# Patient Record
Sex: Male | Born: 1995 | Race: White | Hispanic: No | Marital: Single | Smoking: Never smoker
Health system: Southern US, Community
[De-identification: ages and names within clinical notes are randomized; demographics above are authoritative.]

## PROBLEM LIST (undated history)

## (undated) DIAGNOSIS — R519 Headache, unspecified: Secondary | ICD-10-CM

## (undated) DIAGNOSIS — R51 Headache: Secondary | ICD-10-CM

## (undated) DIAGNOSIS — G514 Facial myokymia: Secondary | ICD-10-CM

## (undated) HISTORY — PX: CIRCUMCISION: SUR203

## (undated) HISTORY — PX: OTHER SURGICAL HISTORY: SHX169

## (undated) HISTORY — DX: Headache: R51

## (undated) HISTORY — DX: Headache, unspecified: R51.9

---
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
## 2016-03-05 ENCOUNTER — Telehealth (INDEPENDENT_AMBULATORY_CARE_PROVIDER_SITE_OTHER): Payer: Self-pay

## 2016-03-05 ENCOUNTER — Encounter (HOSPITAL_BASED_OUTPATIENT_CLINIC_OR_DEPARTMENT_OTHER): Payer: Self-pay | Admitting: *Deleted

## 2016-03-05 ENCOUNTER — Emergency Department (HOSPITAL_BASED_OUTPATIENT_CLINIC_OR_DEPARTMENT_OTHER)
Admission: EM | Admit: 2016-03-05 | Discharge: 2016-03-05 | Disposition: A | Discharge status: Home / Self Care | Payer: Managed Care, Other (non HMO) | Attending: Emergency Medicine | Admitting: Emergency Medicine

## 2016-03-05 DIAGNOSIS — G43011 Migraine without aura, intractable, with status migrainosus: Secondary | ICD-10-CM | POA: Diagnosis not present

## 2016-03-05 DIAGNOSIS — R51 Headache: Secondary | ICD-10-CM | POA: Diagnosis present

## 2016-03-05 HISTORY — DX: Facial myokymia: G51.4

## 2016-03-05 MED ORDER — PROMETHAZINE HCL 25 MG/ML IJ SOLN
12.5000 mg | Freq: Once | INTRAMUSCULAR | Status: AC
  Administered 2016-03-05: 12.5 mg via INTRAVENOUS
  Filled 2016-03-05: qty 1

## 2016-03-05 MED ORDER — SODIUM CHLORIDE 0.9 % IV SOLN: INTRAVENOUS | Status: DC

## 2016-03-05 MED ORDER — SODIUM CHLORIDE 0.9 % IV BOLUS (SEPSIS)
1000.0000 mL | Freq: Once | INTRAVENOUS | Status: AC
  Administered 2016-03-05: 1000 mL via INTRAVENOUS

## 2016-03-05 MED ORDER — DEXAMETHASONE SODIUM PHOSPHATE 10 MG/ML IJ SOLN
10.0000 mg | Freq: Once | INTRAMUSCULAR | Status: AC
  Administered 2016-03-05: 10 mg via INTRAVENOUS
  Filled 2016-03-05: qty 1

## 2016-03-05 MED ORDER — DIPHENHYDRAMINE HCL 50 MG/ML IJ SOLN
25.0000 mg | Freq: Once | INTRAMUSCULAR | Status: AC
  Administered 2016-03-05: 25 mg via INTRAVENOUS
  Filled 2016-03-05: qty 1

## 2016-03-05 NOTE — ED Notes (Signed)
ED Provider at bedside. 

## 2016-03-05 NOTE — ED Provider Notes (Signed)
[facility] Provider Note   CSN: [id number] Arrival date & time: 03/05/16  1740  By signing my name below, I, Teofilo Pod, attest that this documentation has been prepared under the direction and in the presence of Vanetta Mulders, MD . Electronically Signed: Teofilo Pod, ED Scribe. 03/05/2016. 8:13 PM.   History   Chief Complaint Chief Complaint  Patient presents with  . Headache   The history is provided by the patient and a parent. No language interpreter was used.   HPI Comments:  Spencer Bailey is a 21 y.o. male with PMHx of migraines who presents to the Emergency Department complaining of constant headache x 4 days. Pt states that the headaches is primarily frontal and rates the pain at 4 or 5/10. Pt states that he has been having intermittent 3-5 minute episodes of double vision and that his eyes twitch when he moves them. Mom reports that 3 years ago the pt developed superior oblique myokymia of the right eye which she states tends to trigger migraines.  Pt takes Carbamezapine 600mg  daily. Mom reports that pt has been given migraine cocktails in the past for previous migraines. No alleviating factors noted. Pt denies nausea, vomiting, fever, rhinorrhea, sore throat, cough, SOB, chest pain, leg swelling, abdominal pain, diarrhea, dysuria, hematuria, back pain, rash, numbness, weakness, and bleeding easily.   Past Medical History:  Diagnosis Date  . Headache   . Superior oblique myokymia of right eye     Patient Active Problem List   Diagnosis Date Noted  . Concussion without loss of consciousness 07/07/2014  . Episodic tension type headache 12/16/2013  . Superior oblique myokymia of right eye 11/30/2013  . Migraine without aura and without status migrainosus, not intractable 11/30/2013  . History of head injury 11/30/2013    Past Surgical History:  Procedure Laterality Date  . CIRCUMCISION  1998  . OTHER SURGICAL HISTORY     Ankle Surgery x2        Home Medications    Prior to Admission medications   Medication Sig Start Date End Date Taking? Authorizing Provider  carbamazepine (CARBATROL) 200 MG 12 hr capsule Take 2 capsules (400 mg total) by mouth 2 (two) times daily. 08/30/15  Yes Deetta Perla, MD  timolol (TIMOPTIC-XR) 0.5 % ophthalmic gel-forming Place 1 drop into the right eye 2 (two) times daily. 08/30/15  Yes Deetta Perla, MD    Family History Family History  Problem Relation Age of Onset  . Cancer Father   . Diabetes Paternal Grandfather   . COPD Maternal Grandmother     Social History Social History  Substance Use Topics  . Smoking status: Never Smoker  . Smokeless tobacco: Never Used  . Alcohol use No     Allergies   Patient has no known allergies.   Review of Systems Review of Systems  Constitutional: Negative for fever.  HENT: Negative for rhinorrhea and sore throat.   Eyes: Positive for visual disturbance.  Respiratory: Negative for cough and shortness of breath.   Cardiovascular: Negative for chest pain and leg swelling.  Gastrointestinal: Negative for abdominal pain, diarrhea, nausea and vomiting.  Genitourinary: Negative for dysuria and hematuria.  Musculoskeletal: Negative for back pain.  Skin: Negative for rash.  Neurological: Positive for headaches. Negative for weakness and numbness.  Hematological: Does not bruise/bleed easily.  All other systems reviewed and are negative.    Physical Exam Updated Vital Signs BP 121/57 (BP Location: Right Arm)   Pulse  63   Temp 97.8 F (36.6 C) (Oral)   Resp 16   Ht 6\' 2"  (1.88 m)   Wt 73.9 kg   SpO2 100%   BMI 20.93 kg/m   Physical Exam  Constitutional: He appears well-developed and well-nourished.  HENT:  Head: Normocephalic and atraumatic.  Mouth/Throat: Oropharynx is clear and moist.  Eyes: Conjunctivae are normal. Pupils are equal, round, and reactive to light.  Sclera clear, eyes tracking but jumping. Eye  movements are not smooth.   Neck: Neck supple.  Cardiovascular: Normal rate, regular rhythm and normal heart sounds.   No murmur heard. Pulmonary/Chest: Effort normal and breath sounds normal. No respiratory distress.  Lungs clear bilaterally.  Abdominal: Soft. Bowel sounds are normal. There is no tenderness.  Musculoskeletal: He exhibits no edema.  Neurological: He is alert. No cranial nerve deficit or sensory deficit. He exhibits normal muscle tone. Coordination normal.  Skin: Skin is warm and dry.  Psychiatric: He has a normal mood and affect.  Nursing note and vitals reviewed.    ED Treatments / Results  DIAGNOSTIC STUDIES:  Oxygen Saturation is 100% on RA, normal by my interpretation.    COORDINATION OF CARE:  8:12 PM Discussed treatment plan with pt at bedside and pt agreed to plan.   Labs (all labs ordered are listed, but only abnormal results are displayed) Labs Reviewed - No data to display  EKG  EKG Interpretation None       Radiology No results found.  Procedures Procedures (including critical care time)  Medications Ordered in ED Medications  0.9 %  sodium chloride infusion (not administered)  sodium chloride 0.9 % bolus 1,000 mL (0 mLs Intravenous Stopped 03/05/16 2113)  dexamethasone (DECADRON) injection 10 mg (10 mg Intravenous Given 03/05/16 2035)  diphenhydrAMINE (BENADRYL) injection 25 mg (25 mg Intravenous Given 03/05/16 2034)  promethazine (PHENERGAN) injection 12.5 mg (12.5 mg Intravenous Given 03/05/16 2037)     Initial Impression / Assessment and Plan / ED Course  I have reviewed the triage vital signs and the nursing notes.  Pertinent labs & imaging results that were available during my care of the patient were reviewed by me and considered in my medical decision making (see chart for details).     Patient with a known history of migraine headaches. Current symptoms are consistent with a typical migraine for him. They are frequently  triggered by his eye condition which causes twitching of his eyes and causes double vision. In the past when the headache is persisted like it is today it is helped by migraine cocktail. Patient received Decadron Phenergan and Benadryl and IV fluids with significant improvement in the headache.  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 The recorded information has been reviewed and is accurate.        Vanetta MuldersScott Josanne Boerema, MD 03/05/16 2149

## 2016-03-05 NOTE — Discharge Instructions (Signed)
Home and rest in a dark room. Hopefully headache will be completely gone by morning. Return for any new or worse symptoms. Follow-up with his doctors as needed.

## 2016-03-05 NOTE — Telephone Encounter (Signed)
Patient's mother called stating that Spencer Bailey had an episode with his right eye on Thursday. She states that it has been four days and it has not gotten any better. She states that she does not know if Dr. Sharene SkeansHickling would like to see him or if he just needs to speak with her on the phone. I have scheduled him for March 19, 2016 @ 12:00. I informed her that Dr. Rexene EdisonH is out of the office until tomorrow but she will receive a call.   [phone]

## 2016-03-05 NOTE — ED Triage Notes (Signed)
Migraine headache since Thursday.

## 2016-03-05 NOTE — ED Notes (Signed)
Pt c/o migraine type headache for the last five days that has been unrelieved by OTC meds.  Pt states that his opthalmologist wants him to have the "migraine cocktail," before she changes his home meds.

## 2016-03-06 NOTE — Telephone Encounter (Signed)
9 minute phone call with mother.  He continues to have problems with the tremors of his eyes and developed a headache on his way back to school.  [facility] which worked.  His last severe that was in October 2016.  There is no reason to put him on preventative medication for migraines because of the infrequency of these events.  We will obtain a trough carbamazepine level tomorrow which will get sent to my office and I will contact the family.  I asked mother to sign [name] up for My Chart.  I spoke with Dr. Allena KatzPatel and we agreed that a carbamazepine level followed by possible increase in medication would be appropriate.

## 2016-03-07 ENCOUNTER — Telehealth (INDEPENDENT_AMBULATORY_CARE_PROVIDER_SITE_OTHER): Payer: Self-pay

## 2016-03-07 DIAGNOSIS — G514 Facial myokymia: Secondary | ICD-10-CM

## 2016-03-07 NOTE — Telephone Encounter (Signed)
I do not have the result of the carbamazepine level yet.  Unwilling to give him an extra dose of extended release carbamazepine today.  I will going to wait on changing the prescription until I see what the drug level is.  I don't want escalate the dose so fast that he develops carbamazepine toxicity.  Going have to consider whether or not a migraine preventative drug will be necessary.  I hate to introduce it in an acute situation as this.

## 2016-03-07 NOTE — Telephone Encounter (Signed)
Patient's mother, Marylene Landngela, called stating that the patient is back in the bed with a headache today. She states that he has had tremors all day as well. She states that they had the blood work done and she would like to get the results. She also states that she wonders if you all should go up on the medication. She is requesting a call back.    CB:[phone]

## 2016-03-09 ENCOUNTER — Encounter (INDEPENDENT_AMBULATORY_CARE_PROVIDER_SITE_OTHER): Payer: Self-pay | Admitting: Pediatrics

## 2016-03-09 MED ORDER — CARBAMAZEPINE ER 200 MG PO CP12: ORAL_CAPSULE | ORAL | 5 refills | Status: DC

## 2016-03-09 NOTE — Telephone Encounter (Signed)
Carbamazepine level was 5.1 mcg/mL on March 07, 2016.  Spencer PilgrimJacob continues to have continuous tremors of his eyes.  His headache has eased somewhat but continues to be severe.  His mother has requested that I write an order for migraine cocktail that she can take [facility].  I will look at the ED note and write a letter in support of this.  Carbamazepine will be increased to 600 mg twice daily using 3 extended release 200 mg tablets twice daily.

## 2016-03-09 NOTE — Addendum Note (Signed)
Addended by: Deetta PerlaHICKLING, WILLIAM H on: 03/09/2016 10:51 AM   Modules accepted: Orders

## 2016-03-11 ENCOUNTER — Emergency Department (HOSPITAL_BASED_OUTPATIENT_CLINIC_OR_DEPARTMENT_OTHER)
Admission: EM | Admit: 2016-03-11 | Discharge: 2016-03-11 | Disposition: A | Discharge status: Home / Self Care | Payer: Managed Care, Other (non HMO) | Attending: Emergency Medicine | Admitting: Emergency Medicine

## 2016-03-11 ENCOUNTER — Encounter (HOSPITAL_BASED_OUTPATIENT_CLINIC_OR_DEPARTMENT_OTHER): Payer: Self-pay | Admitting: Emergency Medicine

## 2016-03-11 DIAGNOSIS — G43909 Migraine, unspecified, not intractable, without status migrainosus: Secondary | ICD-10-CM | POA: Diagnosis not present

## 2016-03-11 MED ORDER — DEXAMETHASONE SODIUM PHOSPHATE 10 MG/ML IJ SOLN
10.0000 mg | Freq: Once | INTRAMUSCULAR | Status: AC
  Administered 2016-03-11: 10 mg via INTRAVENOUS
  Filled 2016-03-11: qty 1

## 2016-03-11 MED ORDER — DIPHENHYDRAMINE HCL 50 MG/ML IJ SOLN
25.0000 mg | Freq: Once | INTRAMUSCULAR | Status: AC
  Administered 2016-03-11: 25 mg via INTRAVENOUS
  Filled 2016-03-11: qty 1

## 2016-03-11 MED ORDER — PROMETHAZINE HCL 25 MG/ML IJ SOLN
12.5000 mg | Freq: Once | INTRAMUSCULAR | Status: AC
  Administered 2016-03-11: 12.5 mg via INTRAVENOUS
  Filled 2016-03-11: qty 1

## 2016-03-11 NOTE — Discharge Instructions (Signed)
Please read and follow all provided instructions.  Your diagnoses today include:  1. Migraine without status migrainosus, not intractable, unspecified migraine type     Tests performed today include:  Vital signs. See below for your results today.   Medications:  In the Emergency Department you received:  Phenergan - antinausea/headache medication  Benadryl - antihistamine to counteract potential side effects of reglan  Decadron - steroid medication  Take any prescribed medications only as directed.  Additional information:  Follow any educational materials contained in this packet.  You are having a headache. No specific cause was found today for your headache. It may have been a migraine or other cause of headache. Stress, anxiety, fatigue, and depression are common triggers for headaches.   Your headache today does not appear to be life-threatening or require hospitalization, but often the exact cause of headaches is not determined in the emergency department. Therefore, follow-up with your doctor is very important to find out what may have caused your headache and whether or not you need any further diagnostic testing or treatment.   Sometimes headaches can appear benign (not harmful), but then more serious symptoms can develop which should prompt an immediate re-evaluation by your doctor or the emergency department.  BE VERY CAREFUL not to take multiple medicines containing Tylenol (also called acetaminophen). Doing so can lead to an overdose which can damage your liver and cause liver failure and possibly death.   Follow-up instructions: Please follow-up with your primary care provider in the next 3 days for further evaluation of your symptoms.   Return instructions:   Please return to the Emergency Department if you experience worsening symptoms.  Return if the medications do not resolve your headache, if it recurs, or if you have multiple episodes of vomiting or cannot  keep down fluids.  Return if you have a change from the usual headache.  RETURN IMMEDIATELY IF you:  Develop a sudden, severe headache  Develop confusion or become poorly responsive or faint  Develop a fever above 100.50F or problem breathing  Have a change in speech, vision, swallowing, or understanding  Develop new weakness, numbness, tingling, incoordination in your arms or legs  Have a seizure  Please return if you have any other emergent concerns.  Additional Information:  Your vital signs today were: BP 127/79 (BP Location: Right Arm)    Pulse 69    Temp 98 F (36.7 C) (Oral)    Resp 14    Ht 6\' 2"  (1.88 m)    Wt 77.1 kg    SpO2 97%    BMI 21.83 kg/m  If your blood pressure (BP) was elevated above 135/85 this visit, please have this repeated by your doctor within one month. --------------

## 2016-03-11 NOTE — ED Provider Notes (Signed)
[facility] Provider Note   CSN: [id number] Arrival date & time: 03/11/16  1910  By signing my name below, I, Spencer Bailey, attest that this documentation has been prepared under the direction and in the presence of [name], PA-C. Electronically Signed: [name], ED Scribe. 03/11/16. 9:49 PM.   History   Chief Complaint Chief Complaint  Patient presents with  . Headache   The history is provided by the patient and a parent.   HPI Comments: Spencer Bailey is a 21 y.o. 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 Pt denies recent head injury or trauma. Carbamazepine with migraine cocktail usually provides significant relief to symptoms. Pt has had Carbamazepine levels checked 2 days ago. He denies new visual disturbances, nausea, vomiting, appetite change.  Past Medical History:  Diagnosis Date  . Headache   . Superior oblique myokymia of right eye     Patient Active Problem List   Diagnosis Date Noted  . Concussion without loss of consciousness 07/07/2014  . Episodic tension type headache 12/16/2013  . Superior oblique myokymia of right eye 11/30/2013  . Migraine without aura and without status migrainosus, not intractable 11/30/2013  . History of head injury 11/30/2013    Past Surgical History:  Procedure Laterality Date  . CIRCUMCISION  1998  . OTHER SURGICAL HISTORY     Ankle Surgery x2       Home Medications    Prior to Admission medications   Medication Sig Start Date End Date Taking? Authorizing Provider  carbamazepine (CARBATROL) 200 MG 12 hr capsule Take 3 capsules twice daily 03/09/16   Deetta PerlaWilliam H Hickling, MD  timolol (TIMOPTIC-XR) 0.5 % ophthalmic gel-forming Place 1 drop into the right eye 2 (two) times daily. 08/30/15   Deetta PerlaWilliam H Hickling, MD    Family History Family History  Problem Relation Age of  Onset  . Cancer Father   . Diabetes Paternal Grandfather   . COPD Maternal Grandmother     Social History Social History  Substance Use Topics  . Smoking status: Never Smoker  . Smokeless tobacco: Never Used  . Alcohol use No     Allergies   Patient has no known allergies.   Review of Systems Review of Systems  Constitutional: Negative for appetite change and fever.  HENT: Negative for congestion, dental problem, rhinorrhea and sinus pressure.   Eyes: Negative for photophobia, discharge, redness and visual disturbance.  Respiratory: Negative for shortness of breath.   Cardiovascular: Negative for chest pain.  Gastrointestinal: Negative for nausea and vomiting.  Musculoskeletal: Negative for gait problem, neck pain and neck stiffness.  Skin: Negative for rash.  Neurological: Positive for headaches. Negative for syncope, speech difficulty, weakness, light-headedness and numbness.  Psychiatric/Behavioral: Negative for confusion.   Physical Exam Updated Vital Signs BP 118/78 (BP Location: Right Arm)   Pulse 63   Temp 98 F (36.7 C) (Oral)   Resp 16   Ht 6\' 2"  (1.88 m)   Wt 170 lb (77.1 kg)   SpO2 100%   BMI 21.83 kg/m   Physical Exam  Constitutional: He is oriented to person, place, and time. He appears well-developed and well-nourished.  HENT:  Head: Normocephalic and atraumatic.  Right Ear: Tympanic membrane, external ear and ear canal normal.  Left Ear: Tympanic membrane, external ear and ear canal normal.  Nose: Nose normal.  Mouth/Throat: Uvula is midline,  oropharynx is clear and moist and mucous membranes are normal.  Eyes: Conjunctivae, EOM and lids are normal. Pupils are equal, round, and reactive to light.  Neck: Normal range of motion. Neck supple.  Cardiovascular: Normal rate and regular rhythm.   Pulmonary/Chest: Effort normal and breath sounds normal.  Abdominal: Soft. There is no tenderness.  Musculoskeletal: Normal range of motion.       Cervical  back: He exhibits normal range of motion, no tenderness and no bony tenderness.  Neurological: He is alert and oriented to person, place, and time. He has normal strength and normal reflexes. No cranial nerve deficit or sensory deficit. He exhibits normal muscle tone. He displays a negative Romberg sign. Coordination and gait normal. GCS eye subscore is 4. GCS verbal subscore is 5. GCS motor subscore is 6.  Skin: Skin is warm and dry.  Psychiatric: He has a normal mood and affect.  Nursing note and vitals reviewed.    ED Treatments / Results  DIAGNOSTIC STUDIES: Oxygen Saturation is 100% on RA, normal by my interpretation.    COORDINATION OF CARE: 9:48 PM Discussed treatment plan with pt at bedside which includes migraine cocktail and pt agreed to plan.  Procedures Procedures (including critical care time)  Medications Ordered in ED Medications - No data to display   Initial Impression / Assessment and Plan / ED Course  I have reviewed the triage vital signs and the nursing notes.  Pertinent labs & imaging results that were available during my care of the patient were reviewed by me and considered in my medical decision making (see chart for details).     Vital signs reviewed and are as follows: Vitals:   03/11/16 2126 03/11/16 2235  BP: 118/78 127/79  Pulse: 63 69  Resp: 16 14  Temp:     10:46 PM headache improved. Patient ready for discharge to home. Encouraged follow-up with specialist as planned. Return with any worsening or changing symptoms. Patient and father verbalized understanding and agrees with plan.  I personally performed the services described in this documentation, which was scribed in my presence. The recorded information has been reviewed and is accurate.   Final Clinical Impressions(s) / ED Diagnoses   Final diagnoses:  Migraine without status migrainosus, not intractable, unspecified migraine type   Patient with typical HA associated with myokymia --  without high-risk features of headache including: sudden onset/thunderclap HA, no similar headache in past, altered mental status, accompanying seizure, headache with exertion, age > 9, history of immunocompromise, neck or shoulder pain, fever, use of anticoagulation, family history of spontaneous SAH, concomitant drug use, toxic exposure.   Patient has a normal complete neurological exam, normal vital signs, normal level of consciousness, no signs of meningismus, is well-appearing/non-toxic appearing, no signs of trauma.  Imaging with CT/MRI not indicated given history and physical exam findings.   No dangerous or life-threatening conditions suspected or identified by history, physical exam, and by work-up. No indications for hospitalization identified.    New Prescriptions Discharge Medication List as of 03/11/2016 10:42 PM       Renne Crigler, PA-C 03/11/16 2248    Doug Sou, MD 03/11/16 2303

## 2016-03-11 NOTE — ED Triage Notes (Signed)
Pt with hx of superior oblique myokymia, has frequents migraines. Pt reports headache x 10 days. Denies N/V

## 2016-03-12 ENCOUNTER — Encounter (INDEPENDENT_AMBULATORY_CARE_PROVIDER_SITE_OTHER): Payer: Self-pay | Admitting: Pediatrics

## 2016-03-19 ENCOUNTER — Encounter (INDEPENDENT_AMBULATORY_CARE_PROVIDER_SITE_OTHER): Payer: Self-pay | Admitting: Pediatrics

## 2016-03-19 ENCOUNTER — Ambulatory Visit (INDEPENDENT_AMBULATORY_CARE_PROVIDER_SITE_OTHER): Payer: Managed Care, Other (non HMO) | Admitting: Pediatrics

## 2016-03-19 VITALS — BP 110/78 | HR 84 | Ht 73.75 in | Wt 167.2 lb

## 2016-03-19 DIAGNOSIS — G43009 Migraine without aura, not intractable, without status migrainosus: Secondary | ICD-10-CM | POA: Diagnosis not present

## 2016-03-19 DIAGNOSIS — H4911 Fourth [trochlear] nerve palsy, right eye: Secondary | ICD-10-CM

## 2016-03-19 DIAGNOSIS — G514 Facial myokymia: Secondary | ICD-10-CM

## 2016-03-19 NOTE — Patient Instructions (Signed)
Please let me know how I can best support you.  I will set up your next appointment based on your condition.

## 2016-03-19 NOTE — Progress Notes (Signed)
Patient: Spencer Bailey MRN: [id number] Sex: male DOB: 1995/10/16  Provider: Ellison Carwin, MD Location of Care: [facility]  Note type: Routine return visit  History of Present Illness: Referral Source: Rodman Pickle, MD History from: both parents, patient and [facility] chart Chief Complaint: Headaches/Superior Oblique Myokymia of the Right Eye  Spencer Bailey is a 21 y.o. male who returns on March 19, 2016, for the first time since August 30, 2015.  [name] has superior oblique myokymia that in the past has been associated with diplopia and oscillating movements of his eyes that began following repetitive closed head injuries.  He responded to carbamazepine, which was steadily escalated upwards to extended release carbamazepine two twice daily.  On his last visit in July 2017, he had been symptom-free for about nine months.  I considered taking him off carbamazepine, but decided against it.    In the past he had exacerbation of his symptoms during times when he had a heavy reading load.  He was fine until January 25, when his symptoms recurred in an explosive way.  He had very significant oscillations of his eye, which were exacerbated in left gaze and inferior gaze that were virtually constant.  He had simultaneous emergence of very severe headaches that on two occasions required emergency department visits for treatment with a migraine cocktail.    It was unclear what had caused this exacerbation.  His reading load had not been particularly heavy.  He had not been involved in any significant physical activity.  He had been out of school for about three weeks because of the pain, but also because his vision was blurred.  I was in contact with the family and recommended a migraine cocktail on the 29th.    When his symptoms persisted, I increased his dose of carbamazepine to 600 mg twice daily.  At first, there was no response, but over the past several days, he had marked diminution  in the movements of his eyes and corresponding decrease in his headache.  The blurred vision seemed to be more prominent in his right eye than his left eye.  The right eye has always been more symptomatic in terms of the movements, but it is clear that the movements are more pronounced than they had been.    He was seen by Dr. Rodman Pickle, who initially made the diagnosis.  She discussed the case with me and we decided to increase his carbamazepine.  She examined him fully and found no other abnormalities other than his myokymia.  The family has been very diligent in investigating this and found an excellent review article on this condition offered by Dr. Acey Lav of [facility].  This reviewed 116 published cases since it was first described in 1906.  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  Recommendations were made to treat the patient with the topical beta-blocker and suggested levobunolol.  Recommendations were also made to consider antiepileptic medications like carbamazepine and gabapentin.  It was rare for a case of migraine to be coincident with the movement symptoms.  This is properly termed as secondary migraine and responded very nicely to gabapentin.  We discussed repeating an MRI scan of his brain, which has not been done since 2015.  This will allow [place] a closer look at his 4th cranial nerve and also the superior oblique muscle.  We got a very good look of the muscle and the orbits in 2015.  I do not  remember seeing the 4th cranial nerve because we hardly ever see it.  The review article suggested that that was a reasonable approach.  With the improvement of his symptoms with increasing doses of carbamazepine, I inclined to hold off on imaging him for now.  We obtained a carbamazepine level of 5.1 mcg/mL on March 07, 2016.  Spencer Bailey wants to return to school, but he realizes that there are some courses that he will not be able to successfully  catchup, indeed it may be difficult in all of his courses.  His general health has been good.  He says he is drowsier on higher doses of carbamazepine, but that peaks in about a 0.5 hour after taking his morning dose and only lasts for about 15 minutes.  Review of Systems: 12 system review was remarkable for tremors are constat, hard to focus right eye when left eye covered, right eye problems lasting for 3 weeks; the remainder was assessed was negative  Past Medical History Diagnosis Date  . Headache   . Superior oblique myokymia of right eye    Hospitalizations: No., Head Injury: No., Nervous System Infections: No., Immunizations up to date: Yes.    Concussions in May 2012, when he was knocked to the floor while playing basketball, striking his head. He had a second injury in August 2012, when he went over a jump on jetskis, and lost his balance and struck his head on the water. He had a third concussion in October 2014, when he developed a severe headache and had vomiting after a football game.  Migraines about twice the year, but these were associated with pounding pain, nausea, vomiting, sensitivity to light, sound, and movement. Headaches that he had associated with his myokymia were moderate frontal headaches with a mild amount of nausea associated with the motion that he perceived because of the eye movements, but did not have vomiting, sensitivity to light, sound, or movement. In addition the intensity of the headaches was not as great.  MRI scan of the brain on February 17, 2014 showed sub-centimeter T2 hyperintensities in the superficial subcortical region of the frontal and occipital centrum semiovale left greater than right, which were nonspecific findings. Unchanged MRI brain and orbits, November 20, 2013.  Birth History 8 lbs. 11 oz. infant born at [redacted] weeks gestational age to a 20 year old g 1 p 0 male. Gestation was uncomplicated Mother received Epidural anesthesia   Primary cesarean section for fetal distress Nursery Course was uncomplicated Growth and Development was recalled as normal  Behavior History none  Surgical History Procedure Laterality Date  . CIRCUMCISION  1998  . OTHER SURGICAL HISTORY     Ankle Surgery x2    Family History family history includes COPD in his maternal grandmother; Cancer in his father; Diabetes in his paternal grandfather. Family history is negative for migraines, seizures, intellectual disabilities, blindness, deafness, birth defects, chromosomal disorder, or autism.  Social History . Marital status: Single    Spouse name: N/A  . Number of children: N/A  . Years of education: N/A   Social History Main Topics  . Smoking status: Never Smoker  . Smokeless tobacco: Never Used  . Alcohol use No  . Drug use: No  . 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    He lives with his parents and siblings.     He enjoys golf and snowboarding.     He is currently a  student at [employer].   No Known Allergies  Physical Exam BP 110/78   Pulse 84   Ht 6' 1.75" (1.873 m)   Wt 167 lb 3.2 oz (75.8 kg)   BMI 21.61 kg/m    General: alert, well developed, well nourished, in no acute distress, blond hair, blue eyes, right handed Head: normocephalic, no dysmorphic features Ears, Nose and Throat: Otoscopic: tympanic membranes normal; pharynx: oropharynx is pink without exudates or tonsillar hypertrophy Neck: supple, full range of motion, no cranial or cervical bruits Respiratory: auscultation clear Cardiovascular: no murmurs, pulses are normal Musculoskeletal: no skeletal deformities or apparent scoliosis Skin: no rashes or neurocutaneous lesions  Neurologic Exam  Mental Status: alert; oriented to person, place and year; knowledge is normal for age; language is normal Cranial Nerves: visual fields are full to double simultaneous stimuli;  extraocular movements are full and conjugate; with left eye gaze he has significant vascular component nystagmus is also true for inferior days.  At that point he complains of double and blurred vision.  pupils are round reactive to light; funduscopic examination shows sharp disc margins with normal vessels; symmetric facial strength; midline tongue and uvula; air conduction is greater than bone conduction bilaterally Motor: Normal strength, tone and mass; good fine motor movements; no pronator drift Sensory: intact responses to cold, vibration, proprioception and stereognosis Coordination: good finger-to-nose, rapid repetitive alternating movements and finger apposition Gait and Station: normal gait and station: patient is able to walk on heels, toes and tandem without difficulty; balance is adequate; Romberg exam is negative; Gower response is negative Reflexes: symmetric and diminished bilaterally; no clonus; bilateral flexor plantar responses  Assessment 1. 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 2. Migraine without aura without status migrainosus, not intractable, G43.009.  Discussion Looking at the pattern of his headaches, he had very frequent headaches, but I would not turn them status migrainosus.  He became incapacitating on at least a couple of occasions and responded nicely to a migraine cocktail for only about 24 hours.  Increasing carbamazepine seems to have improved his symptoms.  It is hard to know at this time whether that is going to be temporary or more persistent improvement.  He has not stopped taking Timoptic.  I do not know levobunolol is a reasonable alternative.  Plan I asked him to return to see me as needed.  I do not know how soon I will need to see him based on his symptoms.  I asked him to go to school, talk with his professors, and get back with me.  I am happy to write a letter on his behalf to request that he be allowed to drop his courses and withdraw from school for the  semester.    Gabapentin is a reasonable medication to start, but because he has responded so well to carbamazepine, I do not want to start that at this time.  I would not stop carbamazepine in order to introduce gabapentin.    I spent 40 minutes of face-to-face time with Sahaj and his family.  I asked them to communicate with me through my chart concerning his status.  I think we can probably push carbamazepine even a little further before introducing gabapentin.  I am afraid that he will be more symptomatic on gabapentin then carbamazepine.  I will not hesitate to repeat MRI scan of the brain and orbits without and with contrast if his symptoms persist and seemed unresponsive to treatment.   Medication List   Accurate as of  03/19/16 11:59 AM.      carbamazepine 200 MG 12 hr capsule Commonly known as:  CARBATROL Take 3 capsules twice daily   timolol 0.5 % ophthalmic gel-forming Commonly known as:  TIMOPTIC-XR Place 1 drop into the right eye 2 (two) times daily.    The medication list was reviewed and reconciled. All changes or newly prescribed medications were explained.  A complete medication list was provided to the patient/caregiver.  Deetta PerlaWilliam H Aubry Rankin MD

## 2016-03-29 ENCOUNTER — Encounter (INDEPENDENT_AMBULATORY_CARE_PROVIDER_SITE_OTHER): Payer: Self-pay | Admitting: Pediatrics

## 2016-04-27 ENCOUNTER — Encounter (INDEPENDENT_AMBULATORY_CARE_PROVIDER_SITE_OTHER): Payer: Self-pay | Admitting: Pediatrics

## 2016-04-27 ENCOUNTER — Telehealth (INDEPENDENT_AMBULATORY_CARE_PROVIDER_SITE_OTHER): Payer: Self-pay | Admitting: *Deleted

## 2016-04-27 NOTE — Telephone Encounter (Signed)
I have completed the letter and signed it.

## 2016-04-27 NOTE — Telephone Encounter (Signed)
  Who's calling (name and relationship to patient) : Marylene Landngela, mother  Best contact number: [phone]  Provider they see: Dr. Sharene SkeansHickling  Reason for call: Mother dropped off a form [facility] so Spencer PilgrimJacob can withdrawal due to medical condition.  Please call mother with any questions and to pick up when completed at [phone].  Letter placed in Dr. Darl HouseholderHickling's tray.     PRESCRIPTION REFILL ONLY  Name of prescription:  Pharmacy:

## 2016-10-03 ENCOUNTER — Encounter (INDEPENDENT_AMBULATORY_CARE_PROVIDER_SITE_OTHER): Payer: Self-pay | Admitting: Pediatrics

## 2016-10-03 ENCOUNTER — Ambulatory Visit (INDEPENDENT_AMBULATORY_CARE_PROVIDER_SITE_OTHER): Payer: 59 | Admitting: Pediatrics

## 2016-10-03 VITALS — BP 90/60 | HR 80 | Ht 73.75 in | Wt 168.4 lb

## 2016-10-03 DIAGNOSIS — G43009 Migraine without aura, not intractable, without status migrainosus: Secondary | ICD-10-CM | POA: Diagnosis not present

## 2016-10-03 DIAGNOSIS — G514 Facial myokymia: Secondary | ICD-10-CM

## 2016-10-03 MED ORDER — CARBAMAZEPINE ER 200 MG PO CP12: ORAL_CAPSULE | ORAL | 5 refills | Status: DC

## 2016-10-03 NOTE — Progress Notes (Signed)
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 male who returns on October 03, 2016, for the first time since March 19, 2016.  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  These led to very severe migraines.  Headaches would persist for days to weeks at a time and led him to have to curtail his second semester in March 2018.    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  He returned to school in May having been symptom-free for a while.  He took 4 courses this summer and did well in them.  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  These are in public speaking, financial systems, microeconomics, and business.  Three are part of his business major.  He has sporadic headaches, but they are not as severe as they were.  He has not experienced any problems with diplopia or blurred vision that would suggest recurrence of his superior oblique myokymia.  He continues on 1200 mg of carbamazepine per day and timolol eye drops.  He was last seen by Dr. Rodman PickleGrace Bailey some time earlier this year and will be seen in the future.  He is probably less physically active now than he has been.  What has exacerbated his symptoms has been extended reading assignments.  Somehow, he was able to get through a fairly full load this summer without exacerbating his symptoms.  His general health is good.  He is sleeping well.  No other concerns were  raised today.  Review of Systems: 12 system review was assessed and was negative  Past Medical History Diagnosis Date  . Headache   . Superior oblique myokymia of right eye    Hospitalizations: No., Head Injury: No., Nervous System Infections: No., Immunizations up to date: Yes.    Concussions in May 2012, when he was knocked to the floor while playing basketball, striking his head. He had a second injury in August 2012, when he went over a jump on jetskis, and lost his balance and struck his head on the water. He had a third concussion in October 2014, when he developed a severe headache and had vomiting after a football game.  Migraines about twice the year, but these were associated with pounding pain, nausea, vomiting, sensitivity to light, sound, and movement. Headaches that he had associated with his myokymia were moderate frontal headaches with a mild amount of nausea associated with the motion that he perceived because of the eye movements, but did not have vomiting, sensitivity to light, sound, or movement. In addition the intensity of the headaches was not as great.  MRI scan of the brain on February 17, 2014 showed sub-centimeter T2 hyperintensities in the superficial subcortical region of the frontal and occipital centrum semiovale left greater than right, which were nonspecific findings. Unchanged MRI brain and orbits, November 20, 2013.  The family has been very diligent in investigating this and found an excellent review article on this  condition offered by Dr. Acey Lav of [facility].  This reviewed 116 published cases since it was first described in 1906.  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  Recommendations were made to treat the patient with the topical beta-blocker and suggested levobunolol.  Recommendations were also made to consider antiepileptic medications like carbamazepine and gabapentin.  It was rare for a case  of migraine to be coincident with the movement symptoms.  This is properly termed as secondary migraine and responded very nicely to gabapentin.  Birth History 8 lbs. 11 oz. infant born at [redacted] weeks gestational age to a 20 year old g 1 p 0 male. Gestation was uncomplicated Mother received Epidural anesthesia  Primary cesarean section for fetal distress Nursery Course was uncomplicated Growth and Development was recalled as normal  Behavior History none  Surgical History Procedure Laterality Date  . CIRCUMCISION  1998  . OTHER SURGICAL HISTORY     Ankle Surgery x2   Family History family history includes COPD in his maternal grandmother; Cancer in his father; Diabetes in his paternal grandfather. Family history is negative for migraines, seizures, intellectual disabilities, blindness, deafness, birth defects, chromosomal disorder, or autism.  Social History Social History   Social History  . Marital status: Single  . Years of education: 4   Social History Main Topics  . Smoking status: Never Smoker  . Smokeless tobacco: Never Used  . Alcohol use No  . Drug use: No  . 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    He lives with his parents and siblings.     He enjoys golf and snowboarding.     He is currently a Consulting civil engineer at [employer].   No Known Allergies  Physical Exam BP 90/60   Pulse 80   Ht 6' 1.75" (1.873 m)   Wt 168 lb 6.4 oz (76.4 kg)   BMI 21.77 kg/m   General: alert, well developed, well nourished, in no acute distress, blond hair, blue eyes, right handed Head: normocephalic, no dysmorphic features Ears, Nose and Throat: Otoscopic: tympanic membranes normal; pharynx: oropharynx is pink without exudates or tonsillar hypertrophy Neck: supple, full range of motion, no cranial or cervical bruits Respiratory: auscultation clear Cardiovascular: no murmurs, pulses are normal Musculoskeletal:  no skeletal deformities or apparent scoliosis Skin: no rashes or neurocutaneous lesions  Neurologic Exam  Mental Status: alert; oriented to person, place and year; knowledge is normal for age; language is normal Cranial Nerves: visual fields are full to double simultaneous stimuli; extraocular movements are full and conjugate; pupils are round reactive to light; funduscopic examination shows sharp disc margins with normal vessels; symmetric facial strength; midline tongue and uvula; air conduction is greater than bone conduction bilaterally Motor: Normal strength, tone and mass; good fine motor movements; no pronator drift Sensory: intact responses to cold, vibration, proprioception and stereognosis Coordination: good finger-to-nose, rapid repetitive alternating movements and finger apposition Gait and Station: normal gait and station: patient is able to walk on heels, toes and tandem without difficulty; balance is adequate; Romberg exam is negative; Gower response is negative Reflexes: symmetric and diminished bilaterally; no clonus; bilateral flexor plantar responses  Assessment 1. 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 2. Migraine without aura without status migrainosus, not intractable, G43.009.  Discussion I am very pleased with Jake's status.  He did not bring any headache calendars because the headaches have been sporadic and not severe.  Plan Continue  carbamazepine at its current dose.  A prescription was issued for 6 months refill given that I last filled this prescription 6 months ago.  [name] has been prescribing the timolol.  I asked Leta Jungling to return to see me in 6 months' time, but I told him if his symptoms return, that I would see him sooner.  We have approached this problem by steadily increasing carbamazepine.  If he has recurrent symptoms, I will check carbamazepine level and then see if it is possible to increase the dose.    I spent 25 minutes of face-to-face time with Leta Jungling  and his mother.  We discussed continuing his treatment at this time because his symptoms seem to recur and when they do, they have very serious consequences.  We discussed his academic strategy which I think is sound.  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  I spoke in a previous note about switching from carbamazepine to gabapentin.  My first step would be to see if the drug level remains in the lower therapeutic range.  If it does, we can continue to push the medication higher.  There is no reason to make any changes now because he has been stable.  I will see him at Christmas break in December or early January.   Medication List   Accurate as of 10/03/16 11:49 AM.      carbamazepine 200 MG 12 hr capsule Commonly known as:  CARBATROL Take 3 capsules twice daily   timolol 0.5 % ophthalmic gel-forming Commonly known as:  TIMOPTIC-XR Place 1 drop into the right eye 2 (two) times daily.    The medication list was reviewed and reconciled. All changes or newly prescribed medications were explained.  A complete medication list was provided to the patient/caregiver.  Deetta Perla MD

## 2016-10-03 NOTE — Patient Instructions (Signed)
I'm so pleased that you are doing well.  Let me know if I need to write a letter for your school.  I refilled your prescription for carbamazepine because it was last written in February.

## 2017-01-24 ENCOUNTER — Encounter (INDEPENDENT_AMBULATORY_CARE_PROVIDER_SITE_OTHER): Payer: Self-pay | Admitting: Pediatrics

## 2017-01-24 ENCOUNTER — Ambulatory Visit (INDEPENDENT_AMBULATORY_CARE_PROVIDER_SITE_OTHER): Payer: 59 | Admitting: Pediatrics

## 2017-01-24 ENCOUNTER — Other Ambulatory Visit: Payer: Self-pay

## 2017-01-24 VITALS — BP 120/70 | HR 76 | Ht 74.0 in | Wt 168.2 lb

## 2017-01-24 DIAGNOSIS — G43009 Migraine without aura, not intractable, without status migrainosus: Secondary | ICD-10-CM | POA: Diagnosis not present

## 2017-01-24 DIAGNOSIS — G44219 Episodic tension-type headache, not intractable: Secondary | ICD-10-CM

## 2017-01-24 DIAGNOSIS — G514 Facial myokymia: Secondary | ICD-10-CM

## 2017-01-24 DIAGNOSIS — G43109 Migraine with aura, not intractable, without status migrainosus: Secondary | ICD-10-CM

## 2017-01-24 MED ORDER — TIMOLOL MALEATE 0.5 % OP SOLG: 1.0000 [drp] | Freq: Two times a day (BID) | OPHTHALMIC | 5 refills | Status: DC

## 2017-01-24 MED ORDER — CARBAMAZEPINE ER 200 MG PO CP12
ORAL_CAPSULE | ORAL | 5 refills | Status: DC
Start: 2017-01-24 — End: 2017-05-23

## 2017-01-24 NOTE — Progress Notes (Signed)
Patient: Spencer Bailey MRN: [id number] Sex: male DOB: March 06, 1995  Provider: Ellison Carwin, MD Location of Care: [facility]  Note type: Routine return visit  History of Present Illness: Referral Source: Rodman Pickle, MD History from: mother, patient and [facility] chart Chief Complaint: Headaches/Superior Oblique Myokymia of the right eye  Spencer Bailey is a 21 y.o. male who was evaluated on January 24, 2017, for the first time since October 03, 2016.  Spencer Bailey has superior oblique myokymia that is intermittent, associated with diplopia, oscillating movements of his eyes, and coincident severe migraines.  His symptoms persist for hours to days and migraines do as well.  His symptoms began after closed head injuries.  They have been exacerbated just by extensive reading.  He had onset of headaches in January 2018 and had to curtail his second semester in March 2018.  He has gone back to school this fall and has done well.  He has not had any symptoms except for one severe migraine in September or October that occurred in the middle of the night.  He developed numbness on one side of his body.  He cannot remember which.  He was brought to the [facility] emergency room where he was treated with IV fluids and a migraine cocktail with resolution of his symptoms.  He takes a 1200 mg of carbamazepine per day and also timolol eye drops.  I have prescribed both medications for him and will continue to do so.  I am reluctant to make any changes in his treatment of the myokymia because recurrent symptoms are devastating in their severity and duration.  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  He wants to found a fraternity that had previously been on campus, but was closed by the school, [employer].  Review of Systems: A complete review of systems was remarkable for one bad migraine that sent him to the emergency room, numbness, all other systems reviewed and negative.  Past  Medical History Diagnosis Date  . Headache   . Superior oblique myokymia of right eye    Hospitalizations: No., Head Injury: No., Nervous System Infections: No., Immunizations up to date: Yes.    Concussions in May 2012, when he was knocked to the floor while playing basketball, striking his head. He had a second injury in August 2012, when he went over a jump on jetskis, and lost his balance and struck his head on the water. He had a third concussion in October 2014, when he developed a severe headache and had vomiting after a football game.  Migraines about twice the year, but these were associated with pounding pain, nausea, vomiting, sensitivity to light, sound, and movement. Headaches that he had associated with his myokymia were moderate frontal headaches with a mild amount of nausea associated with the motion that he perceived because of the eye movements, but did not have vomiting, sensitivity to light, sound, or movement. In addition the intensity of the headaches was not as great.  MRI scan of the brain on February 17, 2014 showed sub-centimeter T2 hyperintensities in the superficial subcortical region of the frontal and occipital centrum semiovale left greater than right, which were nonspecific findings. Unchanged MRI brain and orbits, November 20, 2013.  The family has been very diligent in investigating this and found an excellent review article on this condition offered by Dr. Acey Lav of [facility]. This reviewed 116 published cases since it was first described in  1906. 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 Recommendations were made to treat the patient with the topical beta-blocker and suggested levobunolol. Recommendations were also made to consider antiepileptic medications like carbamazepine and gabapentin. It was rare for a case of migraine to be coincident with the movement symptoms. This is properly termed as  secondary migraine and responded very nicely to gabapentin.  Birth History 8 lbs. 11 oz. infant born at 339 weeks gestational age to a 20 year old g 1 p 0 male. Gestation was uncomplicated Mother received Epidural anesthesia  Primary cesarean section for fetal distress Nursery Course was uncomplicated Growth and Development was recalled as normal  Behavior History none  Surgical History Past Surgical History:  Procedure Laterality Date  . CIRCUMCISION  1998  . OTHER SURGICAL HISTORY     Ankle Surgery x2    Family History family history includes COPD in his maternal grandmother; Cancer in his father; Diabetes in his paternal grandfather. Family history is negative for migraines, seizures, intellectual disabilities, blindness, deafness, birth defects, chromosomal disorder, or autism.  Social History Social Needs  . Financial resource strain: None  . Food insecurity - worry: None  . Food insecurity - inability: None  . Transportation needs - medical: None  . Transportation needs - non-medical: None  Occupational History  . None  Tobacco Use  . Smoking status: Never Smoker  . Smokeless tobacco: Never Used  Substance and Sexual Activity  . Alcohol use: No    Alcohol/week: 0.0 oz  . Drug use: No  . 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    He lives with his parents and siblings.     He enjoys golf and snowboarding.     He is currently a Consulting civil engineerstudent at [employer].   No Known Allergies  Physical Exam BP 120/70   Pulse 76   Ht 6\' 2"  (1.88 m)   Wt 168 lb 3.2 oz (76.3 kg)   BMI 21.60 kg/m   General: alert, well developed, well nourished, in no acute distress, blond hair, blue eyes, right handed Head: normocephalic, no dysmorphic features Ears, Nose and Throat: Otoscopic: tympanic membranes normal; pharynx: oropharynx is pink without exudates or tonsillar hypertrophy Neck: supple, full range of  motion, no cranial or cervical bruits Respiratory: auscultation clear Cardiovascular: no murmurs, pulses are normal Musculoskeletal: no skeletal deformities or apparent scoliosis Skin: no rashes or neurocutaneous lesions  Neurologic Exam  Mental Status: alert; oriented to person, place and year; knowledge is normal for age; language is normal Cranial Nerves: visual fields are full to double simultaneous stimuli; extraocular movements are full and conjugate; pupils are round reactive to light; funduscopic examination shows sharp disc margins with normal vessels; symmetric facial strength; midline tongue and uvula; air conduction is greater than bone conduction bilaterally Motor: Normal strength, tone and mass; good fine motor movements; no pronator drift Sensory: intact responses to cold, vibration, proprioception and stereognosis Coordination: good finger-to-nose, rapid repetitive alternating movements and finger apposition Gait and Station: normal gait and station: patient is able to walk on heels, toes and tandem without difficulty; balance is adequate; Romberg exam is negative; Gower response is negative Reflexes: symmetric and diminished bilaterally; no clonus; bilateral flexor plantar responses  Assessment 1. 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 2. Migraine without aura without status migrainosus, not intractable, G43.009. 3. Episodic tension-type headache, not intractable, G44.219. 4. 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  Discussion I  think that the episode in September or October, was a complicated migraine where he had numbness coincident with his headache.  I am pleased that his headaches have been infrequent and hope that that will continue.  I am also pleased that he is doing well in school and not having any other problems either with his health or the medicines that he takes.  Plan I spent 15 minutes of face-to-face time with Spencer Bailey and his mother, more than half of it  in consultation.  He will return to see me in six months' time.  I will see him sooner based on clinical need.   Medication List    Accurate as of 01/24/17 11:59 PM.      carbamazepine 200 MG 12 hr capsule Commonly known as:  CARBATROL Take 3 capsules twice daily   timolol 0.5 % ophthalmic gel-forming Commonly known as:  TIMOPTIC-XR Place 1 drop into the right eye 2 (two) times daily.    The medication list was reviewed and reconciled. All changes or newly prescribed medications were explained.  A complete medication list was provided to the patient/caregiver.  Deetta PerlaWilliam H Dshaun Reppucci MD

## 2017-01-24 NOTE — Patient Instructions (Signed)
I would make no change in your current medication.  It was great to see you.  If you have any problems, please let me know either through My Chart or by calling.

## 2017-05-19 ENCOUNTER — Encounter (INDEPENDENT_AMBULATORY_CARE_PROVIDER_SITE_OTHER): Payer: Self-pay | Admitting: Pediatrics

## 2017-05-20 ENCOUNTER — Telehealth (INDEPENDENT_AMBULATORY_CARE_PROVIDER_SITE_OTHER): Payer: Self-pay | Admitting: Pediatrics

## 2017-05-20 NOTE — Telephone Encounter (Signed)
Patient has been scheduled for 05/22/17 at 2:00 per Dr. Sharene SkeansHickling. Mother is aware. Rufina FalcoEmily M Hull

## 2017-05-20 NOTE — Telephone Encounter (Signed)
There is a 2 PM opening in the resident schedule for Spencer Bailey on Wednesday, April 17.  Please place him in that slot.

## 2017-05-22 ENCOUNTER — Ambulatory Visit (INDEPENDENT_AMBULATORY_CARE_PROVIDER_SITE_OTHER): Payer: 59 | Admitting: Pediatrics

## 2017-05-22 ENCOUNTER — Encounter (INDEPENDENT_AMBULATORY_CARE_PROVIDER_SITE_OTHER): Payer: Self-pay | Admitting: Pediatrics

## 2017-05-22 VITALS — BP 112/64 | HR 88 | Ht 74.0 in | Wt 165.8 lb

## 2017-05-22 DIAGNOSIS — G43109 Migraine with aura, not intractable, without status migrainosus: Secondary | ICD-10-CM | POA: Diagnosis not present

## 2017-05-22 DIAGNOSIS — G514 Facial myokymia: Secondary | ICD-10-CM | POA: Diagnosis not present

## 2017-05-22 DIAGNOSIS — Z79899 Other long term (current) drug therapy: Secondary | ICD-10-CM | POA: Diagnosis not present

## 2017-05-22 DIAGNOSIS — G43009 Migraine without aura, not intractable, without status migrainosus: Secondary | ICD-10-CM

## 2017-05-22 NOTE — Progress Notes (Signed)
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  Per Spencer Jungling, this started again last Thursday while studying (4/11). He has noted intermittent (with diplopia) movements of his eyes and severe headaches. This time he states the headaches are debilitating but do not put him on bed rest. His headache has been persistent (around the R eye muscle) since Thursday. He has not gone to the emergency department or seen any other provider yet. The symptoms are worsened by reading. He has gone about 14 months since his last episode.  He continues on the 1200mg  carbamazepine in 2 divided doses as well as timolol eye drops. He has not missed any doses and is not taking any new medications.   Review of Systems: A complete review of systems was assessed and negative.  Past Medical History Diagnosis Date  . Headache   . Superior oblique myokymia of right eye    Hospitalizations: No., Head Injury: No., Nervous System Infections: No., Immunizations up to date: Yes.    Concussions in May 2012, when he was knocked to the floor while playing basketball, striking his head. He had a second injury in August 2012, when he went over a jump on jetskis, and lost his balance and struck his head on the water. He had a third concussion in October 2014, when he developed a severe headache and had vomiting after a football game.  Migraines about twice the year, but these were associated with pounding pain, nausea, vomiting, sensitivity to light, sound, and movement. Headaches  that he had associated with his myokymia were moderate frontal headaches with a mild amount of nausea associated with the motion that he perceived because of the eye movements, but did not have vomiting, sensitivity to light, sound, or movement.  MRI scan of the brain on February 17, 2014 showed sub-centimeter T2 hyperintensities in the superficial subcortical region of the frontal and occipital centrum semiovale left greater than right, which were nonspecific findings. Base of the brain failed to show any evidence of entrapment of cranial nerves but the 4th cranial nerve was not specifically seen.  Unchanged MRI brain and orbits, November 20, 2013.  The family has been very diligent in investigating this and found an excellent review article on this condition offered by Dr. Acey Lav of [facility]. Family again brings up the potential of gabapentin as well as botox in the treatment of his symptoms. They are interested in trying a triptan to help with the headache pain.   Birth History 8 lbs. 11 oz. infant born at [redacted] weeks gestational age to a 21 year old g 1 p 0 male. Gestation was uncomplicated Mother received Epidural anesthesia  Primary cesarean section for fetal distress Nursery Course was uncomplicated Growth and Development was recalled as normal  Behavior History none  Surgical History Procedure Laterality Date  . CIRCUMCISION  1998  . OTHER SURGICAL HISTORY     Ankle Surgery x2   Family History family history includes COPD in his maternal grandmother; Cancer in his father; Diabetes in his paternal grandfather. Family history is negative for  migraines, seizures, intellectual disabilities, blindness, deafness, birth defects, chromosomal disorder, or autism.  Social History Socioeconomic History  . Marital status: Single  . Years of education: 54  . Highest education level:  Holiday representative in college at [employer]  Occupational History  . Not working  Social Needs  .  Financial resource strain: Not on file  . Food insecurity:    Worry: Not on file    Inability: Not on file  . Transportation needs:    Medical: Not on file    Non-medical: Not on file  Tobacco Use  . Smoking status: Never Smoker  . Smokeless tobacco: Never Used  Substance and Sexual Activity  . Alcohol use: No    Alcohol/week: 0.0 oz  . Drug use: No  . 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    He lives with his parents and siblings.     He enjoys golf and snowboarding.     He is currently a Consulting civil engineer at [employer].   No Known Allergies  Physical Exam BP 112/64   Pulse 88   Ht 6\' 2"  (1.88 m)   Wt 75.2 kg (165 lb 12.8 oz)   BMI 21.29 kg/m   General: alert, well developed, well nourished, in no acute distress, blond hair/blue eyes, very tall and thin tired appearing, pale;, leaning against the wall Head: normocephalic, no dysmorphic features Ears, Nose and Throat: Otoscopic: tympanic membranes normal; pharynx: oropharynx is pink without exudates or tonsillar hypertrophy Neck: supple, full range of motion, no cranial or cervical bruits Respiratory: auscultation clear Cardiovascular: no murmurs, pulses are normal Musculoskeletal: no skeletal deformities or apparent scoliosis Skin: no rashes or neurocutaneous lesions  Neurologic Exam  Mental Status: alert; oriented to person, place and year; knowledge is normal for age; language is normal Cranial Nerves: visual fields are full to double simultaneous stimuli; extraocular movements are full and conjugate; pupils are round reactive to light; funduscopic examination shows sharp disc margins with normal vessels; symmetric facial strength; midline tongue and uvula; air conduction is greater than bone conduction bilaterally; his myokymia can occasionally be seen in his right eye as he deliberately deviates his eyes to the right to block it Motor: Normal strength,  tone and mass; good fine motor movements; no pronator drift Sensory: intact responses to cold, vibration Coordination: good finger-to-nose Gait and Station: normal Reflexes: symmetric and diminished bilaterally; no clonus  Assessment 1. 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 2. Migraine without aura without status migrainosus, not intractable, G43.009. 3. Episodic tension-type headache, not intractable, G44.219. 4. 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  Discussion This appears to be a recurrent episode of his superior oblique myokymia of his right eye as well as a concurrent migraine. We discussed that I would like to get a carbamazepine level tomorrow AM prior to his usual dose. This will determine if we can increase his dose safely. I will also test LFTs and CBC to ensure we are not causing harm from the medication. Although we potentially can increase his carbamazepine, we discussed that the family should again reopen the discussion with [name] as well as with Dr. Durene Cal at [facility]. Although we have maintained Spencer Bailey without incidents for 14 months, we discussed that I would like to have the experts weigh-in. In the meantime we will test the carbamazepine level as well as trial sumatriptan.   Plan We will determine the plan based on the carbamazepine level. I  will be in contact once that is received. I encouraged family to reach out to Dr. Allena KatzPatel and Dr. Durene CalHunter to discuss further ophthalmological options as well as further the discussion about other treatment options with the expert at [facility].    Medication List    Accurate as of 05/22/17  3:11 PM.      carbamazepine 200 MG 12 hr capsule Commonly known as:  CARBATROL Take 3 capsules twice daily   timolol 0.5 % ophthalmic gel-forming Commonly known as:  TIMOPTIC-XR Place 1 drop into the right eye 2 (two) times daily.    The medication list was reviewed and reconciled. All changes or newly prescribed  medications were explained.  A complete medication list was provided to the patient/caregiver.  Lady Deutscherachael Lester, MD PGY 3  40 minutes of face-to-face time was spent with Spencer Bailey and his parents we discussed the issues related to his recurrent headaches, more than half of it in consultation.  I performed physical examination, participated in history taking, and guided decision making..  There is nothing that I can think of that actually triggered these however he is moving toward finals time at [employer] when he will have to spend countless hours of reading and studying which in the past has exacerbated his headaches.  I asked him whether or not if he passes I that he could read more comfortably and whether or not that would ease his migraines.  I recommended that he try Imitrex to see if that would help lessen his headache.  I suggested that he could go to the emergency department if he needed to obtain relief that he could not through the use of triptan medicine.  Finally I recommended that he contact Dr. Allena KatzPatel to see if she wants to change the eyedrops to a different medication suggested by Dr. Durene CalHunter.  I told the family that I would be happy to discuss a consultation with him in [place] given his extensive expertise with his condition.  I also told the family that I would be willing to start low-dose gabapentin but would not stop carbamazepine at the same time which could set up a situation where he became excessively sleepy.  Deetta PerlaWilliam H Hickling MD

## 2017-05-22 NOTE — Patient Instructions (Signed)
My plan is to obtain a morning trough carbamazepine level, and check liver function and blood counts.  We will try to increase his carbamazepine tomorrow if possible.  If not, we can try gabapentin.  I strongly recommended that you contact Dr. Durene CalHunter to see if he would be able to see Leta JunglingJake and I will be happy to do whatever it takes make that happen.  We might also consider whether or not a repeat MRI scan of the brain without and with contrast with a 3 Tesla study makes any sense.  In something that is periodic is this is, it is hard to believe that there is a structural problem.

## 2017-05-22 NOTE — Progress Notes (Deleted)
Patient: Spencer Bailey MRN: [id number] Sex: male DOB: 02/22/1995  Provider: Ellison Carwin, MD Location of Care: [facility]  Note type: Routine return visit  History of Present Illness: Referral Source: Rodman Pickle, MD History from: both parents, patient and [facility] chart Chief Complaint: Headaches/Superior Oblique Myokymia of the Right Eye  Spencer Bailey is a 22 y.o. male who ***  Review of Systems: A complete review of systems was unremarkable.  Past Medical History Past Medical History:  Diagnosis Date  . Headache   . Superior oblique myokymia of right eye    Hospitalizations: No., Head Injury: No., Nervous System Infections: No., Immunizations up to date: Yes.    ***  Birth History *** lbs. *** oz. infant born at *** weeks gestational age to a *** year old g *** p *** *** *** *** male. Gestation was {Complicated/Uncomplicated Pregnancy:20185} Mother received {CN Delivery analgesics:210120005}  {method of delivery:313099} Nursery Course was {Complicated/Uncomplicated:20316} Growth and Development was {cn recall:210120004}  Behavior History {Symptoms; behavioral problems:18883}  Surgical History Past Surgical History:  Procedure Laterality Date  . CIRCUMCISION  1998  . OTHER SURGICAL HISTORY     Ankle Surgery x2    Family History family history includes COPD in his maternal grandmother; Cancer in his father; Diabetes in his paternal grandfather. Family history is negative for migraines, seizures, intellectual disabilities, blindness, deafness, birth defects, chromosomal disorder, or autism.  Social History Social History   Socioeconomic History  . Marital status: Single    Spouse name: Not on file  . Number of children: Not on file  . Years of education: Not on file  . Highest education level: Not on file  Occupational History  . Not on file  Social Needs  . Financial resource strain: Not on file  . Food insecurity:    Worry: Not  on file    Inability: Not on file  . Transportation needs:    Medical: Not on file    Non-medical: Not on file  Tobacco Use  . Smoking status: Never Smoker  . Smokeless tobacco: Never Used  Substance and Sexual Activity  . Alcohol use: No    Alcohol/week: 0.0 oz  . Drug use: No  . Sexual activity: Never  Lifestyle  . Physical activity:    Days per week: Not on file    Minutes per session: Not on file  . Stress: Not on file  Relationships  . Social connections:    Talks on phone: Not on file    Gets together: Not on file    Attends religious service: Not on file    Active member of club or organization: Not on file    Attends meetings of clubs or organizations: Not on file    Relationship status: Not on file  Other Topics Concern  . 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   He lives with his parents and siblings.    He enjoys golf and snowboarding.    He is currently a Consulting civil engineer at [employer].     Allergies No Known Allergies  Physical Exam BP 112/64   Pulse 88   Ht 6\' 2"  (1.88 m)   Wt 165 lb 12.8 oz (75.2 kg)   BMI 21.29 kg/m   ***   Assessment   Discussion   Plan  Allergies as of 05/22/2017   No Known Allergies     Medication List  Accurate as of 05/22/17  2:08 PM. Always use your most recent med list.          carbamazepine 200 MG 12 hr capsule Commonly known as:  CARBATROL Take 3 capsules twice daily   timolol 0.5 % ophthalmic gel-forming Commonly known as:  TIMOPTIC-XR Place 1 drop into the right eye 2 (two) times daily.       The medication list was reviewed and reconciled. All changes or newly prescribed medications were explained.  A complete medication list was provided to the patient/caregiver.  Deetta PerlaWilliam H Valeta Paz MD

## 2017-05-23 ENCOUNTER — Emergency Department (HOSPITAL_BASED_OUTPATIENT_CLINIC_OR_DEPARTMENT_OTHER)
Admission: EM | Admit: 2017-05-23 | Discharge: 2017-05-24 | Disposition: A | Discharge status: Home / Self Care | Payer: 59 | Attending: Emergency Medicine | Admitting: Emergency Medicine

## 2017-05-23 ENCOUNTER — Encounter (HOSPITAL_BASED_OUTPATIENT_CLINIC_OR_DEPARTMENT_OTHER): Payer: Self-pay | Admitting: *Deleted

## 2017-05-23 ENCOUNTER — Other Ambulatory Visit: Payer: Self-pay

## 2017-05-23 ENCOUNTER — Encounter (INDEPENDENT_AMBULATORY_CARE_PROVIDER_SITE_OTHER): Payer: Self-pay | Admitting: Pediatrics

## 2017-05-23 DIAGNOSIS — G43909 Migraine, unspecified, not intractable, without status migrainosus: Secondary | ICD-10-CM | POA: Diagnosis not present

## 2017-05-23 DIAGNOSIS — G43009 Migraine without aura, not intractable, without status migrainosus: Secondary | ICD-10-CM

## 2017-05-23 DIAGNOSIS — Z79899 Other long term (current) drug therapy: Secondary | ICD-10-CM | POA: Diagnosis not present

## 2017-05-23 DIAGNOSIS — G514 Facial myokymia: Secondary | ICD-10-CM

## 2017-05-23 MED ORDER — CARBAMAZEPINE ER 200 MG PO CP12: ORAL_CAPSULE | ORAL | 5 refills | Status: DC

## 2017-05-23 MED ORDER — DEXAMETHASONE SODIUM PHOSPHATE 10 MG/ML IJ SOLN
10.0000 mg | Freq: Once | INTRAMUSCULAR | Status: AC
  Administered 2017-05-23: 10 mg via INTRAVENOUS
  Filled 2017-05-23: qty 1

## 2017-05-23 MED ORDER — SUMATRIPTAN SUCCINATE 50 MG PO TABS: ORAL_TABLET | ORAL | 5 refills | Status: DC

## 2017-05-23 MED ORDER — PROMETHAZINE HCL 25 MG/ML IJ SOLN
12.5000 mg | Freq: Once | INTRAMUSCULAR | Status: AC
  Administered 2017-05-23: 12.5 mg via INTRAVENOUS
  Filled 2017-05-23: qty 1

## 2017-05-23 MED ORDER — DIPHENHYDRAMINE HCL 50 MG/ML IJ SOLN
25.0000 mg | Freq: Once | INTRAMUSCULAR | Status: AC
  Administered 2017-05-23: 25 mg via INTRAVENOUS
  Filled 2017-05-23: qty 1

## 2017-05-23 MED ORDER — KETOROLAC TROMETHAMINE 30 MG/ML IJ SOLN
30.0000 mg | Freq: Once | INTRAMUSCULAR | Status: AC
  Administered 2017-05-23: 30 mg via INTRAVENOUS
  Filled 2017-05-23: qty 1

## 2017-05-23 MED ORDER — SODIUM CHLORIDE 0.9 % IV BOLUS
1000.0000 mL | Freq: Once | INTRAVENOUS | Status: AC
  Administered 2017-05-23: 1000 mL via INTRAVENOUS

## 2017-05-23 NOTE — ED Triage Notes (Signed)
Migraine for a week. He was seen by his neurologist this week and his medication was increased. He had Imitrex today and it helped a little but not completely.

## 2017-05-23 NOTE — ED Provider Notes (Signed)
[facility] EMERGENCY DEPARTMENT Provider Note   CSN: [id number] Arrival date & time: 05/23/17  1956     History   Chief Complaint Chief Complaint  Patient presents with  . Migraine    HPI Spencer Bailey is a 22 y.o. male.  Patient here with 1 week of a typical migraine headache.  He saw his neurologist Dr. Sharene Skeans yesterday and was given Imitrex.  And his carbamazepine was increased.  He is here with his mother tonight to "get some relief".  Reports pain similar to previous episodes of migraine.  Does have a history of superior oblique myokymia which is thought to be the source of his migraines.  He reports intermittent double vision.  No photophobia or phonophobia.  No fever.  No focal weakness, numbness or tingling.  No difficulty breathing or difficulty swallowing.  No chest pain or shortness of breath.  He has a note from Dr. Sharene Skeans stating he should receive Decadron, Phenergan, Benadryl.  The history is provided by the patient and a relative.  Migraine  Associated symptoms include headaches. Pertinent negatives include no chest pain, no abdominal pain and no shortness of breath.    Past Medical History:  Diagnosis Date  . Headache   . Superior oblique myokymia of right eye     Patient Active Problem List   Diagnosis Date Noted  . Complicated migraine 01/24/2017  . Concussion without loss of consciousness 07/07/2014  . Episodic tension type headache 12/16/2013  . Superior oblique myokymia of right eye 11/30/2013  . Migraine without aura and without status migrainosus, not intractable 11/30/2013  . History of head injury 11/30/2013    Past Surgical History:  Procedure Laterality Date  . CIRCUMCISION  1998  . OTHER SURGICAL HISTORY     Ankle Surgery x2        Home Medications    Prior to Admission medications   Medication Sig Start Date End Date Taking? Authorizing Provider  carbamazepine (CARBATROL) 200 MG 12 hr capsule Take 3 capsules in the  morning and 4 at nighttime 05/23/17   Deetta Perla, MD  SUMAtriptan (IMITREX) 50 MG tablet Take 1 at the onset of migraine with 400 mg of ibuprofen do not exceed 1/day 05/23/17   Deetta Perla, MD  timolol (TIMOPTIC-XR) 0.5 % ophthalmic gel-forming Place 1 drop into the right eye 2 (two) times daily. 01/24/17   Deetta Perla, MD    Family History Family History  Problem Relation Age of Onset  . Cancer Father   . Diabetes Paternal Grandfather   . COPD Maternal Grandmother     Social History Social History   Tobacco Use  . Smoking status: Never Smoker  . Smokeless tobacco: Never Used  Substance Use Topics  . Alcohol use: No    Alcohol/week: 0.0 oz  . Drug use: No     Allergies   Patient has no known allergies.   Review of Systems Review of Systems  Constitutional: Negative for activity change, appetite change and fever.  Eyes: Positive for visual disturbance. Negative for photophobia.  Respiratory: Negative for cough and shortness of breath.   Cardiovascular: Negative for chest pain.  Gastrointestinal: Negative for abdominal pain, nausea and vomiting.  Genitourinary: Negative for dysuria, hematuria and testicular pain.  Musculoskeletal: Negative for arthralgias and myalgias.  Skin: Negative for rash.  Neurological: Positive for headaches. Negative for dizziness, tremors and weakness.    all other systems are negative except as noted in the HPI and  PMH.    Physical Exam Updated Vital Signs BP 125/75 (BP Location: Right Arm)   Pulse 62   Temp 98.7 F (37.1 C) (Oral)   Resp 18   Ht 6\' 2"  (1.88 m)   Wt 76.2 kg (168 lb)   SpO2 100%   BMI 21.57 kg/m   Physical Exam  Constitutional: He is oriented to person, place, and time. He appears well-developed and well-nourished. No distress.  HENT:  Head: Normocephalic and atraumatic.  Mouth/Throat: Oropharynx is clear and moist. No oropharyngeal exudate.  Eyes: Pupils are equal, round, and reactive to  light. Conjunctivae and EOM are normal.  Neck: Normal range of motion. Neck supple.  No meningismus.  Cardiovascular: Normal rate, regular rhythm, normal heart sounds and intact distal pulses.  No murmur heard. Pulmonary/Chest: Effort normal and breath sounds normal. No respiratory distress. He exhibits no tenderness.  Abdominal: Soft. There is no tenderness. There is no rebound and no guarding.  Musculoskeletal: Normal range of motion. He exhibits no edema or tenderness.  Neurological: He is alert and oriented to person, place, and time. No cranial nerve deficit. He exhibits normal muscle tone. Coordination normal.  CN 2-12 intact, no ataxia on finger to nose, no nystagmus, 5/5 strength throughout, no pronator drift, Romberg negative, normal gait. EOMI intact. No nystagmus   Skin: Skin is warm.  Psychiatric: He has a normal mood and affect. His behavior is normal.  Nursing note and vitals reviewed.    ED Treatments / Results  Labs (all labs ordered are listed, but only abnormal results are displayed) Labs Reviewed - No data to display  EKG None  Radiology No results found.  Procedures Procedures (including critical care time)  Medications Ordered in ED Medications  sodium chloride 0.9 % bolus 1,000 mL (1,000 mLs Intravenous New Bag/Given 05/23/17 2325)  dexamethasone (DECADRON) injection 10 mg (10 mg Intravenous Given 05/23/17 2325)  ketorolac (TORADOL) 30 MG/ML injection 30 mg (30 mg Intravenous Given 05/23/17 2325)  promethazine (PHENERGAN) injection 12.5 mg (12.5 mg Intravenous Given 05/23/17 2325)  diphenhydrAMINE (BENADRYL) injection 25 mg (25 mg Intravenous Given 05/23/17 2325)     Initial Impression / Assessment and Plan / ED Course  I have reviewed the triage vital signs and the nursing notes.  Pertinent labs & imaging results that were available during my care of the patient were reviewed by me and considered in my medical decision making (see chart for details).     1 week of typical migraine headache associated with pain above his right eye.  No changes in his vision though he gets double vision at time which is typical of his symptoms.  No focal weakness, numbness or tingling.  Gradual onset headache.  Low suspicion for subarachnoid hemorrhage, meningitis or temporal arteritis.  Patient given IV fluids, Toradol, Phenergan, Benadryl and Decadron per his neurologist recommendations.  He denies any change from his chronic migraine pattern.  Patient feels improved on recheck and is requesting discharge.  Follow-up with his neurologist.  Return precautions discussed.    Final Clinical Impressions(s) / ED Diagnoses   Final diagnoses:  Migraine without status migrainosus, not intractable, unspecified migraine type    ED Discharge Orders    None       Sosha Shepherd, Jeannett SeniorStephen, MD 05/24/17 [phone]

## 2017-05-23 NOTE — Telephone Encounter (Signed)
I spoke with mother and told her that we could increase the dose now that we have the blood work.  I will check my labs tomorrow and contact the family via my chart.  Apparently going to switch the eyedrops according to Dr. Allena KatzPatel.  He is in a fragile condition we will have to make small incremental steps and hope that they work.

## 2017-05-24 ENCOUNTER — Telehealth (INDEPENDENT_AMBULATORY_CARE_PROVIDER_SITE_OTHER): Payer: Self-pay | Admitting: Pediatrics

## 2017-05-24 LAB — CBC WITH DIFFERENTIAL/PLATELET
Basophils Absolute: 49 cells/uL (ref 0–200)
Basophils Relative: 0.9 %
EOS PCT: 4.1 %
Eosinophils Absolute: 221 cells/uL (ref 15–500)
HEMATOCRIT: 49.2 % (ref 38.5–50.0)
HEMOGLOBIN: 16.6 g/dL (ref 13.2–17.1)
LYMPHS ABS: 2581 {cells}/uL (ref 850–3900)
MCH: 29.4 pg (ref 27.0–33.0)
MCHC: 33.7 g/dL (ref 32.0–36.0)
MCV: 87.1 fL (ref 80.0–100.0)
MPV: 10 fL (ref 7.5–12.5)
Monocytes Relative: 7.8 %
Neutro Abs: 2128 cells/uL (ref 1500–7800)
Neutrophils Relative %: 39.4 %
Platelets: 298 10*3/uL (ref 140–400)
RBC: 5.65 10*6/uL (ref 4.20–5.80)
RDW: 12.6 % (ref 11.0–15.0)
Total Lymphocyte: 47.8 %
WBC: 5.4 10*3/uL (ref 3.8–10.8)
WBCMIX: 421 {cells}/uL (ref 200–950)

## 2017-05-24 LAB — ALT: ALT: 10 U/L (ref 9–46)

## 2017-05-24 LAB — CARBAMAZEPINE LEVEL, TOTAL: Carbamazepine Lvl: 3.9 mg/L — ABNORMAL LOW (ref 4.0–12.0)

## 2017-05-24 NOTE — Telephone Encounter (Signed)
Reporting laboratories to the family

## 2017-05-24 NOTE — Discharge Instructions (Addendum)
Take your medications as prescribed.  Follow-up with Dr. Sharene SkeansHickling.  Return to the ED if you develop new or worsening symptoms.

## 2017-05-27 ENCOUNTER — Encounter (INDEPENDENT_AMBULATORY_CARE_PROVIDER_SITE_OTHER): Payer: Self-pay | Admitting: Pediatrics

## 2017-05-29 ENCOUNTER — Encounter (INDEPENDENT_AMBULATORY_CARE_PROVIDER_SITE_OTHER): Payer: Self-pay | Admitting: Pediatrics

## 2017-05-30 ENCOUNTER — Telehealth (INDEPENDENT_AMBULATORY_CARE_PROVIDER_SITE_OTHER): Payer: Self-pay | Admitting: Pediatrics

## 2017-05-30 DIAGNOSIS — G43009 Migraine without aura, not intractable, without status migrainosus: Secondary | ICD-10-CM

## 2017-05-30 MED ORDER — GABAPENTIN 300 MG PO CAPS: ORAL_CAPSULE | ORAL | 1 refills | Status: DC

## 2017-05-30 NOTE — Telephone Encounter (Signed)
New Message  Pt c/o medication issue:  1. Name of Medication:  Gabapentin  2. How are you currently taking this medication (dosage and times per day)?  Not listed in chart  3. Are you having a reaction (difficulty breathing--STAT)?  N/A  4. What is your medication issue?  Pts mom verbalized she received a VM stating pt's medication was called in but it is not listed in pts chart to confirm if it was sent to pharmacy.  Pts mom verbalized to call her once rx has been sent.  Please f/u

## 2017-05-30 NOTE — Telephone Encounter (Signed)
Informed mom that prescription has been sent to the pharmacy

## 2017-05-30 NOTE — Telephone Encounter (Signed)
Please let Mom know that the Rx has been sent to [facility] in [place]. TG

## 2017-05-31 ENCOUNTER — Encounter (INDEPENDENT_AMBULATORY_CARE_PROVIDER_SITE_OTHER): Payer: Self-pay | Admitting: Pediatrics

## 2017-06-03 ENCOUNTER — Encounter (INDEPENDENT_AMBULATORY_CARE_PROVIDER_SITE_OTHER): Payer: Self-pay | Admitting: Pediatrics

## 2017-06-11 ENCOUNTER — Encounter (INDEPENDENT_AMBULATORY_CARE_PROVIDER_SITE_OTHER): Payer: Self-pay | Admitting: Pediatrics

## 2017-06-11 ENCOUNTER — Telehealth (INDEPENDENT_AMBULATORY_CARE_PROVIDER_SITE_OTHER): Payer: Self-pay | Admitting: Pediatrics

## 2017-06-11 DIAGNOSIS — G514 Facial myokymia: Secondary | ICD-10-CM

## 2017-06-11 DIAGNOSIS — G43009 Migraine without aura, not intractable, without status migrainosus: Secondary | ICD-10-CM

## 2017-06-11 MED ORDER — GABAPENTIN 300 MG PO CAPS: ORAL_CAPSULE | ORAL | 5 refills | Status: DC

## 2017-06-11 MED ORDER — GABAPENTIN 300 MG PO CAPS
ORAL_CAPSULE | ORAL | 1 refills | Status: DC
Start: 2017-06-11 — End: 2017-06-11

## 2017-06-11 NOTE — Telephone Encounter (Signed)
°  Who's calling (name and relationship to patient) : Marylene Land - mom  Best contact number: [phone]  Provider they see: Sharene Skeans  Reason for call: stated that she has been in communication with provider via my chart in regards to a letter that was to be written. She is requesting that someone calls her when letter is ready for pick up

## 2017-06-11 NOTE — Telephone Encounter (Signed)
Prescription has been written and I contacted the family to start an extra 300 mg tablet Wednesday or Thursday, 1 week after the dose is increased from 1 to 2/day.  This could be given morning, mid to late afternoon, and bedtime

## 2017-06-11 NOTE — Telephone Encounter (Signed)
Rx has been sent to the pharmacy. Since the dose has been increased, I changed the quantity to 60.

## 2017-06-12 ENCOUNTER — Encounter (INDEPENDENT_AMBULATORY_CARE_PROVIDER_SITE_OTHER): Payer: Self-pay | Admitting: Pediatrics

## 2017-06-12 NOTE — Telephone Encounter (Signed)
Mom called back to check on letter. Mom would like a call back when letter is ready.

## 2017-06-13 ENCOUNTER — Encounter (INDEPENDENT_AMBULATORY_CARE_PROVIDER_SITE_OTHER): Payer: Self-pay | Admitting: Pediatrics

## 2017-06-13 NOTE — Progress Notes (Signed)
Letter to [place]

## 2017-06-20 ENCOUNTER — Ambulatory Visit (INDEPENDENT_AMBULATORY_CARE_PROVIDER_SITE_OTHER): Payer: 59 | Admitting: Pediatrics

## 2017-06-20 ENCOUNTER — Encounter (INDEPENDENT_AMBULATORY_CARE_PROVIDER_SITE_OTHER): Payer: Self-pay | Admitting: Pediatrics

## 2017-06-20 VITALS — BP 104/66 | HR 100 | Ht 73.0 in | Wt 168.6 lb

## 2017-06-20 DIAGNOSIS — G514 Facial myokymia: Secondary | ICD-10-CM

## 2017-06-20 DIAGNOSIS — G43009 Migraine without aura, not intractable, without status migrainosus: Secondary | ICD-10-CM | POA: Diagnosis not present

## 2017-06-20 NOTE — Progress Notes (Signed)
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 male who was evaluated on Jun 20, 2017 for the first time since May 22, 2017.  He has superior oblique myokymia and migraines.  He had onset of a bout of myokymia while studying on April 11.  He had intermittent rotatory movements of his eyes, diplopia, and severe headaches.  Though the headaches are debilitating, they did not put him on bedrest, but he was unable to concentrate or read.  At that time, he was taking 1200 mg of carbamazepine per day in 2 divided doses and timolol eye drops.  His family has researched this topic and has identified Dr. Acey Lav of [facility], who has published some studies on this condition.  Gabapentin has been used as another antiepileptic medication, which seems to be useful for this condition.  I increased carbamazepine to a total of 1600 mg twice daily by increasing by one tablet at nighttime and then later one tablet by day.  Gabapentin was started and titrated to a total of 300 mg 3 times daily.  Jake's last episode was on May 10.  In the interim, he had to withdraw from school and will have to take the courses that he had taken at a later date, possibly this summer and this fall.  He now looks well and is not having any symptoms.  The family leaves to go to [place] on Monday to see Dr. Durene Cal.  I talked to [facility] and suggested that if there is some way to find a video that have been made of this, it would be helpful.  Otherwise, Dr. Durene Cal will have nothing to see.  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  He is going to work referring flag football at [employer] and then  will return back to school on June 24.  Review of Systems: A complete review of systems was assessed and was negative.  Past Medical History Diagnosis Date  . Headache   . Superior oblique myokymia of right eye    Hospitalizations: No., Head Injury: No., Nervous System Infections: No., Immunizations up to date: Yes.    Concussions in May 2012, when he was knocked to the floor while playing basketball, striking his head. He had a second injury in August 2012, when he went over a jump on jetskis, and lost his balance and struck his head on the water. He had a third concussion in October 2014, when he developed a severe headache and had vomiting after a football game.  Migraines about twice the year, but these were associated with pounding pain, nausea, vomiting, sensitivity to light, sound, and movement. Headaches that he had associated with his myokymia were moderate frontal headaches with a mild amount of nausea associated with the motion that he perceived because of the eye movements, but did not have vomiting, sensitivity to light, sound, or movement.  MRI scan of the brain on February 17, 2014 showed sub-centimeter T2 hyperintensities in the superficial subcortical region of the frontal and occipital centrum semiovale left greater than right, which were nonspecific findings. Base of the brain failed to show any evidence of entrapment of cranial nerves but the 4th  cranial nerve was not specifically seen.  Unchanged MRI brain and orbits, November 20, 2013.  The family has been very diligent in investigating this and found an excellent review article on this condition offered by Dr. Acey Lav of [facility]. Family again brings up the potential of gabapentin as well as botox in the treatment of his symptoms. They are interested in trying a triptan to help with the headache pain.   Birth History 8 lbs. 11 oz. infant born at [redacted] weeks gestational age to a 21 year old g 1 p 0  male. Gestation was uncomplicated Mother received Epidural anesthesia  Primary cesarean section for fetal distress Nursery Course was uncomplicated Growth and Development was recalled as normal  Behavior History none  Surgical History Procedure Laterality Date  . CIRCUMCISION  1998  . OTHER SURGICAL HISTORY     Ankle Surgery x2   Metabolic heFamily History family history includes COPD in his maternal grandmother; Cancer in his father; Diabetes in his paternal grandfather. Family history is negative for migraines, seizures, intellectual disabilities, blindness, deafness, birth defects, chromosomal disorder, or autism.  Social History Socioeconomic History  . Marital status: Single  . Years of education:  53  . Highest education level:  Sophomore in college  Occupational History  .  Will work part-time this summer  Social Needs  . Financial resource strain: Not on file  . Food insecurity:    Worry: Not on file    Inability: Not on file  . Transportation needs:    Medical: Not on file    Non-medical: Not on file  Tobacco Use  . Smoking status: Never Smoker  . Smokeless tobacco: Never Used  Substance and Sexual Activity  . Alcohol use: No    Alcohol/week: 0.0 oz  . Drug use: No  . 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    He lives with his parents and siblings.     He enjoys golf and snowboarding.     He is currently a Consulting civil engineer at [employer].   No Known Allergies  Physical Exam BP 104/66   Pulse 100   Ht  (1.854 m)   Wt 168 lb 9.6 oz (76.5 kg)   BMI 22.24 kg/m   General: alert, well developed, well nourished, in no acute  distress, blond hair, blue eyes, right handed Head: normocephalic, no dysmorphic features Ears, Nose and Throat: Otoscopic: tympanic membranes normal; pharynx: oropharynx is pink without exudates or tonsillar hypertrophy Neck: supple, full range of motion, no  cranial or cervical bruits Respiratory: auscultation clear Cardiovascular: no murmurs, pulses are normal Musculoskeletal: no skeletal deformities or apparent scoliosis Skin: no rashes or neurocutaneous lesions  Neurologic Exam  Mental Status: alert; oriented to person, place and year; knowledge is normal for age; language is normal Cranial Nerves: visual fields are full to double simultaneous stimuli; extraocular movements are full and conjugate; pupils are round reactive to light; funduscopic examination shows sharp disc margins with normal vessels; symmetric facial strength; midline tongue and uvula; air conduction is greater than bone conduction bilaterally Motor: Normal strength, tone and mass; good fine motor movements; no pronator drift Sensory: intact responses to cold, vibration, proprioception and stereognosis Coordination: good finger-to-nose, rapid repetitive alternating movements and finger apposition Gait and Station: normal gait and station: patient is able to walk on heels, toes and tandem without difficulty; balance is adequate; Romberg exam is negative; Gower response is negative  Reflexes: symmetric and diminished bilaterally; no clonus; bilateral flexor plantar responses  Assessment 1. 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 2. Migraine without aura without status migrainosus, not intractable, G43.009.  Discussion Symptoms have completely resolved.  His headaches actually achieved status migrainosus when he is experiencing myokymia, because the symptoms are nearly continuous.  I do not know if this spontaneously resolved, or if the combination of gabapentin plus carbamazepine was useful.  I think that his eye drops have also changed, but I do not know the name of them.  Plan I told Leta Jungling that we would be happy to send records up to Dr. Durene Cal.  I think that I have already done that, but I am not certain.  I also told him that if there were any tests that we could  do in [place], that we would be happy to do them and send the results to Dr. Durene Cal because the family will certainly be out of network when they go to [place].  Leta Jungling will return to see me in about 12 weeks.  This will allow him to see me just before he heads back to [place].  I spent 15 minutes of face-to-face time with Jake.  I did not change any of his medications at this time and have no plans to do so at least for now.  I asked him to get back with me once the family had return from [place].   Medication List    Accurate as of 06/20/17 11:59 PM.      carbamazepine 200 MG 12 hr capsule Commonly known as:  CARBATROL Take 3 capsules in the morning and 4 at nighttime   gabapentin 300 MG capsule Commonly known as:  NEURONTIN Take 1 capsule 3 times daily   SUMAtriptan 50 MG tablet Commonly known as:  IMITREX Take 1 at the onset of migraine with 400 mg of ibuprofen do not exceed 1/day   timolol 0.5 % ophthalmic gel-forming Commonly known as:  TIMOPTIC-XR Place 1 drop into the right eye 2 (two) times daily.    The medication list was reviewed and reconciled. All changes or newly prescribed medications were explained.  A complete medication list was provided to the patient/caregiver.  Deetta Perla MD

## 2017-06-20 NOTE — Patient Instructions (Signed)
I am pleased that this bout of superior oblique myokymia stopped on the 10th and with it eye pain and migraine headaches.  It is possible to find the video that has been made of this it would be important to bring that with you when you go to [place].  If Dr. Durene Cal wants to perform an MRI scan we have to state-of-the-art 3 Tesla scanners in town and could perform that in network rather than out of network as it would be in [place].  We then can send the scans to him.  For now I do not want to make any changes in either the carbamazepine gabapentin, or the new eyedrops.  After your trip to [place], we can think about cutting carbamazepine back to where it was at 3 tablets twice daily.  I obviously do not want to precipitate another bout of the condition.   It seems to me that when there is a lot of reading to do and not enough sleep, that is more likely that this begins although I know is more complicated than that.  It is not clear to me why you got better.  I think that we sent records to Dr. Durene Cal.  If for some reason that did not happen or they could not find them our fax number is [phone].  We will be happy to send records immediately as needed.

## 2017-06-26 ENCOUNTER — Encounter (INDEPENDENT_AMBULATORY_CARE_PROVIDER_SITE_OTHER): Payer: Self-pay | Admitting: Pediatrics

## 2017-07-02 ENCOUNTER — Encounter (INDEPENDENT_AMBULATORY_CARE_PROVIDER_SITE_OTHER): Payer: Self-pay | Admitting: Pediatrics

## 2017-07-02 ENCOUNTER — Telehealth (INDEPENDENT_AMBULATORY_CARE_PROVIDER_SITE_OTHER): Payer: Self-pay | Admitting: Pediatrics

## 2017-07-02 DIAGNOSIS — G44329 Chronic post-traumatic headache, not intractable: Secondary | ICD-10-CM

## 2017-07-02 DIAGNOSIS — G43009 Migraine without aura, not intractable, without status migrainosus: Secondary | ICD-10-CM

## 2017-07-02 DIAGNOSIS — G514 Facial myokymia: Secondary | ICD-10-CM

## 2017-07-02 NOTE — Telephone Encounter (Signed)
When you have time, bring me up-to-date about this.

## 2017-07-02 NOTE — Telephone Encounter (Signed)
I just talked with the patient and also with the radiologist I think that were set up for ordering the test.  We have to go through prior authorization which I am sure may be problematic.

## 2017-07-03 ENCOUNTER — Other Ambulatory Visit (INDEPENDENT_AMBULATORY_CARE_PROVIDER_SITE_OTHER): Payer: Self-pay | Admitting: Pediatrics

## 2017-07-03 DIAGNOSIS — G43009 Migraine without aura, not intractable, without status migrainosus: Secondary | ICD-10-CM

## 2017-07-03 DIAGNOSIS — G514 Facial myokymia: Secondary | ICD-10-CM

## 2017-07-10 ENCOUNTER — Ambulatory Visit (INDEPENDENT_AMBULATORY_CARE_PROVIDER_SITE_OTHER): Payer: 59 | Admitting: Pediatrics

## 2017-07-12 ENCOUNTER — Ambulatory Visit (HOSPITAL_COMMUNITY)
Admission: RE | Admit: 2017-07-12 | Discharge: 2017-07-12 | Disposition: A | Discharge status: Home / Self Care | Payer: 59 | Source: Ambulatory Visit | Attending: Pediatrics | Admitting: Pediatrics

## 2017-07-12 ENCOUNTER — Telehealth (INDEPENDENT_AMBULATORY_CARE_PROVIDER_SITE_OTHER): Payer: Self-pay | Admitting: Pediatrics

## 2017-07-12 DIAGNOSIS — J323 Chronic sphenoidal sinusitis: Secondary | ICD-10-CM | POA: Diagnosis not present

## 2017-07-12 DIAGNOSIS — G514 Facial myokymia: Secondary | ICD-10-CM | POA: Diagnosis not present

## 2017-07-12 DIAGNOSIS — G43009 Migraine without aura, not intractable, without status migrainosus: Secondary | ICD-10-CM | POA: Insufficient documentation

## 2017-07-12 DIAGNOSIS — G44329 Chronic post-traumatic headache, not intractable: Secondary | ICD-10-CM | POA: Diagnosis present

## 2017-07-12 MED ORDER — GADOBENATE DIMEGLUMINE 529 MG/ML IV SOLN
15.0000 mL | Freq: Once | INTRAVENOUS | Status: AC | PRN
  Administered 2017-07-12: 15 mL via INTRAVENOUS

## 2017-07-12 NOTE — Telephone Encounter (Signed)
MRI was normal.  A My Chart message was sent.

## 2017-08-14 ENCOUNTER — Encounter (INDEPENDENT_AMBULATORY_CARE_PROVIDER_SITE_OTHER): Payer: Self-pay | Admitting: Pediatrics

## 2017-09-02 ENCOUNTER — Encounter (INDEPENDENT_AMBULATORY_CARE_PROVIDER_SITE_OTHER): Payer: Self-pay | Admitting: Pediatrics

## 2017-09-17 ENCOUNTER — Encounter (INDEPENDENT_AMBULATORY_CARE_PROVIDER_SITE_OTHER): Payer: Self-pay | Admitting: Pediatrics

## 2017-09-17 ENCOUNTER — Ambulatory Visit (INDEPENDENT_AMBULATORY_CARE_PROVIDER_SITE_OTHER): Payer: 59 | Admitting: Pediatrics

## 2017-09-17 VITALS — BP 130/70 | HR 84 | Ht 73.75 in | Wt 175.2 lb

## 2017-09-17 DIAGNOSIS — G43009 Migraine without aura, not intractable, without status migrainosus: Secondary | ICD-10-CM | POA: Diagnosis not present

## 2017-09-17 DIAGNOSIS — G514 Facial myokymia: Secondary | ICD-10-CM

## 2017-09-17 DIAGNOSIS — G44219 Episodic tension-type headache, not intractable: Secondary | ICD-10-CM

## 2017-09-17 MED ORDER — SUMATRIPTAN SUCCINATE 50 MG PO TABS: ORAL_TABLET | ORAL | 5 refills | Status: DC

## 2017-09-17 MED ORDER — CARBAMAZEPINE ER 200 MG PO CP12: ORAL_CAPSULE | ORAL | 5 refills | Status: DC

## 2017-09-17 NOTE — Progress Notes (Signed)
Patient: Spencer Bailey MRN: [id number] Sex: male DOB: Aug 26, 1995  Provider: Ellison CarwinWilliam Hickling, MD [facility]  Note type: Routine return visit  History of Present Illness: Referral Source: Rodman PickleGrace Patel, MD History from: both parents, patient and [facility] Chief Complaint: Headaches/Superior Oblique Myokymia of the right eye  Spencer Bailey is a 22 y.o. male who returns on September 17, 2017 for the first time since Jun 20, 2017.  Spencer Bailey has superior oblique myokymia and migraines.  This began after a series of 3 concussions that occurred over about 2-1/2 years.    I initially evaluated him in February 2013 when he presented for evaluation of headaches and vomiting.  He had a viral syndrome with fever, chills, myalgias, nausea, and vomiting in December 2012 and when he returned to play basketball in early January, he was easily fatigued, unsteady on his feet, had an early morning frontal headache.  MRI scan of the brain on February 18, 2011 showed subcentimeter T2 hyperintensities in the superficial subcortical region of the frontal and occipital centrum semiovale, left greater than right, which were nonspecific findings sometimes seen with migraineurs.  There was a strong family history of migraine.    I saw him again on November 30, 2013 when he was referred by Dr. Rodman PickleGrace Patel, pediatric ophthalmologist to evaluate headaches associated with involuntary movement of his right eye associated with diplopia.  In the presence of oscillating movements of the right eye, which she described as superior oblique myokymia.  This was evaluated with Dr. Clydie BraunBhatti, [facility].  MRI scan of the brain was performed and unchanged.  Plans were made to treat him with carbamazepine and timolol and his symptoms subsided.  He has had several exacerbations.    One occurred in late May 2016.  He had taken himself off medication and while working out with the football team he  became entangled with the legs of another player and fell backwards, striking his head.  He developed myokymia almost immediately with his headache and had migrainous symptoms that were different from his typical migraines and associated with diplopia.  I recommended that he restart his medication and his symptoms subsided.    The next exacerbation occurred in October 2016 soon after he returned to school and begun intensive reading.  His symptoms were severe enough that he went to the hospital for migraine cocktail the night before I saw him.  I suggested that since looking downward seem to bring about his symptoms, that he work on trying to place his computer and books in such a ways that he did not have to look down.  I adjusted his carbamazepine and recommended that he continue to take Timoptic.    His next exacerbation occurred on March 05, 2016 that resulted in treatment in the emergency department on that day and also February 4.  I saw him on February 12.  On this occasion, it was unclear what had exacerbated his symptoms.  This interfered with the second semester of his freshman year.  He was able to return to school in May 2018 and had been symptom-free when I saw him in late August 2018.  We have steadily increased his dose of carbamazepine and he continued on timolol eyedrops.    On November 23, 2016, [place] numbness and vision symptoms in the left side that occurred in association with generalized headache while he was playing basketball.  This was a diagnosis of migraine without aura.  Fortunately, his symptoms did not recur.    His next exacerbation occurred on May 16, 2017 while studying.  He wind up having to leave school because the symptoms persisted.  He had a couple of ED treatments for migraine.    His family was in contact with Dr. Acey Lav, a pediatric ophthalmologist at [facility] who has experience with the  condition.  He recommended gabapentin, which we started because carbamazepine had basically maxed out.  This brought his symptoms under control.  He remained on eye drops, carbamazepine, and gabapentin.  The family went to see Dr. Durene Cal, who recommended an MRI scan of the brain, which we carried out with 3 Tesla scanner without and with contrast.  This showed no evidence of nerve compression.  The study has been sent to [facility].  We do not know yet if Dr. Durene Cal has reviewed it, but I have been in contact with his office and they said that they had the study, although at the time that I contacted him which was in late July, he had been on vacation.  The family presents today and Spencer Jungling is fine.  He is planning to return to school where he will be a second semester sophomore because of the time that he has missed.  He has taken some summer school courses.  Spencer Jungling wants to simplify his regimen, which I think is a reasonable thing to do.  Gabapentin worked very well to control his symptoms and it is not clear that carbamazepine is helping him.  I reviewed these records in order to have all the information in one location.  Review of Systems: A complete review of systems was remarkable for patient reports he would like to wean off the medication he is currently taking, all other systems reviewed and negative.  Past Medical History Diagnosis Date  . Headache   . Superior oblique myokymia of right eye    Hospitalizations: No., Head Injury: No., Nervous System Infections: No., Immunizations up to date: Yes.    Concussions in May 2012, when he was knocked to the floor while playing basketball, striking his head. He had a second injury in August 2012, when he went over a jump on jetskis, and lost his balance and struck his head on the water. He had a third concussion in October 2014, when he developed a severe headache and had vomiting after a football game.  Migraines about twice the year, but these were  associated with pounding pain, nausea, vomiting, sensitivity to light, sound, and movement. Headaches that he had associated with his myokymia were moderate frontal headaches with a mild amount of nausea associated with the motion that he perceived because of the eye movements, but did not have vomiting, sensitivity to light, sound, or movement.  MRI scan of the brain on February 17, 2014 showed sub-centimeter T2 hyperintensities in the superficial subcortical region of the frontal and occipital centrum semiovale left greater than right, which were nonspecific findings. Base of the brain failed to show any evidence of entrapment of cranial nerves but the 4th cranial nerve was not specifically seen. Unchanged MRI brain and orbits, November 20, 2013.  The family has been very diligent in investigating this and found an excellent review article on this condition offered by Dr. Acey Lav of [facility].Family again brings up the potential of gabapentin as well as botox in the treatment of his symptoms. They are interested in trying a  triptan to help with the headache pain.  MRI brain and orbits July 12, 2017 was normal other than the white matter changes noted previously there was no abnormality otherwise including impingement of the trochlear nerve.  Birth History 8 lbs. 11 oz. infant born at [redacted] weeks gestational age to a 21 year old g 1 p 0 male. Gestation was uncomplicated Mother received Epidural anesthesia  Primary cesarean section for fetal distress Nursery Course was uncomplicated Growth and Development was recalled asnormal  Behavior History none  Surgical History Procedure Laterality Date  . CIRCUMCISION  1998  . OTHER SURGICAL HISTORY     Ankle Surgery x2   Family History family history includes COPD in his maternal grandmother; Cancer in his father; Diabetes in his paternal grandfather. Family history is negative for migraines, seizures, intellectual disabilities,  blindness, deafness, birth defects, chromosomal disorder, or autism.  Social History Social History   Socioeconomic History  . Marital status: Single  . Years of education:  26  . Highest education level:  Second semester sophomore year  Occupational History  .  Not currently employed  Social Needs  . Financial resource strain: Not on file  . Food insecurity:    Worry: Not on file    Inability: Not on file  . Transportation needs:    Medical: Not on file    Non-medical: Not on file  Tobacco Use  . Smoking status: Never Smoker  . Smokeless tobacco: Never Used  Substance and Sexual Activity  . Alcohol use: No    Alcohol/week: 0.0 standard drinks  . Drug use: No  . 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    He lives with his parents and siblings.     He enjoys golf and snowboarding.     He is currently a Consulting civil engineer at [employer].   No Known Allergies  Physical Exam BP 130/70   Pulse 84   Ht 6' 1.75" (1.873 m)   Wt 175 lb 3.2 oz (79.5 kg)   BMI 22.65 kg/m   General: alert, well developed, well nourished, in no acute distress, blond hair, blue eyes, right handed Head: normocephalic, no dysmorphic features Ears, Nose and Throat: Otoscopic: tympanic membranes normal; pharynx: oropharynx is pink without exudates or tonsillar hypertrophy Neck: supple, full range of motion, no cranial or cervical bruits Respiratory: auscultation clear Cardiovascular: no murmurs, pulses are normal Musculoskeletal: no skeletal deformities or apparent scoliosis Skin: no rashes or neurocutaneous lesions  Neurologic Exam  Mental Status: alert; oriented to person, place and year; knowledge is normal for age; language is normal Cranial Nerves: visual fields are full to double simultaneous stimuli; extraocular movements are full and conjugate; pupils are round reactive to light; funduscopic examination shows sharp disc margins  with normal vessels; symmetric facial strength; midline tongue and uvula; air conduction is greater than bone conduction bilaterally Motor: Normal strength, tone and mass; good fine motor movements; no pronator drift Sensory: intact responses to cold, vibration, proprioception and stereognosis Coordination: good finger-to-nose, rapid repetitive alternating movements and finger apposition Gait and Station: normal gait and station: patient is able to walk on heels, toes and tandem without difficulty; balance is adequate; Romberg exam is negative; Gower response is negative Reflexes: symmetric and diminished bilaterally; no clonus; bilateral flexor plantar responses  Assessment 1. 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 2. Migraine without aura without status migrainosus, not intractable, G43.009. 3. Episodic tension-type headache, not  intractable, G44.219.  Discussion I discussed this at length with Spencer Bailey and his parents.  I am going to contact Dr. Durene CalHunter by e-mail and make certain that he has reviewed the images.  I do not think that he is going to find anything that would allow him to decompress the nerve root.  He talked with the family about some form of recession procedure of the superior oblique, which he thought might decrease the likelihood of myokymia.  I do not have any experience with this and we will need to talk to Dr. Durene CalHunter about it.  In the interim, we have discussed slowly taking him off carbamazepine which in retrospect seems like a good idea.  I would drop him by about 200 mg every other week, so that we could gradually ease him from carbamazepine.  Unfortunately, this happens at a time when his reading is going to markedly increase, as he returns to school and so it may not be the best time to try to change his treatments.  Spencer Bailey is going to need to discuss this with his family and tell me what they want to do.  On one hand, it would be good to try to simplify his regimen,  on the other if he had onset of recurrent symptoms of superior oblique myokymia, it would be useful for him to that go see Dr. Durene CalHunter while he was having his symptoms.  He was asymptomatic when he was evaluated in [place].  Plan Greater than 50% of a 40 minute visit was spent in counseling, coordination of care regarding his superior oblique myokymia and his relationship to migraines, reviewing his history which I have done above, and his imaging studies as well as contacting Dr. Durene CalHunter.  He will return to see me in 4 months unless he has exacerbation of his symptoms.  The family will let me know if they want to begin tapering carbamazepine, which I intend to do very slowly over a period of several months.   Medication List    Accurate as of 09/17/17 12:13 PM.      carbamazepine 200 MG 12 hr capsule Commonly known as:  CARBATROL Take 3 capsules in the morning and 4 at nighttime   gabapentin 300 MG capsule Commonly known as:  NEURONTIN TAKE 1 CAPSULE BY MOUTH THREE TIMES A DAY   SUMAtriptan 50 MG tablet Commonly known as:  IMITREX Take 1 at the onset of migraine with 400 mg of ibuprofen do not exceed 1/day   timolol 0.5 % ophthalmic gel-forming Commonly known as:  TIMOPTIC-XR Place 1 drop into the right eye 2 (two) times daily.    The medication list was reviewed and reconciled. All changes or newly prescribed medications were explained.  A complete medication list was provided to the patient/caregiver.  Deetta PerlaWilliam H Hickling MD

## 2017-09-17 NOTE — Patient Instructions (Signed)
We discussed the approach to dealing with this long-term problem.  I will email Dr. Durene CalHunter and find out if he has had a chance to review the images and what his recommendations with the.  Since he has not seen the condition active, is not unreasonable to try to taper medication.  If nothing happens then we will continue to simplify the treatment regimen.  If symptoms recur, then that would be the best time for Dr. Durene CalHunter to see Leta JunglingJake.  I have no idea whether this surgical recession would help.  I have to depend on Dr. Erasmo LeventhalHunter's opinion for that.  You may need to go see him another time before a decision can be made.  We decided today that if we taper anything, it would be the carbamazepine first.

## 2017-11-25 ENCOUNTER — Encounter (INDEPENDENT_AMBULATORY_CARE_PROVIDER_SITE_OTHER): Payer: Self-pay

## 2017-11-26 ENCOUNTER — Telehealth (INDEPENDENT_AMBULATORY_CARE_PROVIDER_SITE_OTHER): Payer: Self-pay | Admitting: Pediatrics

## 2017-11-26 DIAGNOSIS — G43009 Migraine without aura, not intractable, without status migrainosus: Secondary | ICD-10-CM

## 2017-11-26 DIAGNOSIS — G514 Facial myokymia: Secondary | ICD-10-CM

## 2017-11-26 MED ORDER — GABAPENTIN 300 MG PO CAPS: ORAL_CAPSULE | ORAL | 3 refills | Status: DC

## 2017-11-26 NOTE — Telephone Encounter (Signed)
Rx has been sent electronically to the pharmacy with the increased doses

## 2017-11-26 NOTE — Telephone Encounter (Signed)
°  Who's calling (name and relationship to patient) : Marylene Land (Mother)  Best contact number: [phone] Provider they see: Dr. Sharene Skeans  Reason for call: Mom stated she spoke with someone in the office regarding a refill request for Gabapentin. Mom stated that pt does not need refill because he has one already though there may need to be a new rx sent in for Gabapentin with the increased dosage.      PRESCRIPTION REFILL ONLY  Name of prescription: Gabapentin Pharmacy: [facility] [address]

## 2017-11-29 ENCOUNTER — Encounter (INDEPENDENT_AMBULATORY_CARE_PROVIDER_SITE_OTHER): Payer: Self-pay

## 2017-12-01 ENCOUNTER — Encounter (INDEPENDENT_AMBULATORY_CARE_PROVIDER_SITE_OTHER): Payer: Self-pay

## 2017-12-01 DIAGNOSIS — G43009 Migraine without aura, not intractable, without status migrainosus: Secondary | ICD-10-CM

## 2017-12-01 DIAGNOSIS — G514 Facial myokymia: Secondary | ICD-10-CM

## 2017-12-02 ENCOUNTER — Telehealth (INDEPENDENT_AMBULATORY_CARE_PROVIDER_SITE_OTHER): Payer: Self-pay | Admitting: Pediatrics

## 2017-12-02 MED ORDER — PROMETHAZINE HCL 25 MG PO TABS: ORAL_TABLET | ORAL | 0 refills | Status: DC

## 2017-12-02 MED ORDER — DEXAMETHASONE 4 MG PO TABS: ORAL_TABLET | ORAL | 0 refills | Status: DC

## 2017-12-02 MED ORDER — GABAPENTIN 300 MG PO CAPS: ORAL_CAPSULE | ORAL | 3 refills | Status: DC

## 2017-12-02 NOTE — Telephone Encounter (Signed)
°  Who's calling (name and relationship to patient) : Marylene Land (Mother) Best contact number: [phone] Provider they see: Dr. Sharene Skeans  Reason for call: Mom called to follow up on Mychart message she sent to Dr. Sharene Skeans regarding pt's medications. I informed mom that the Mychart message was routed to Dr. Sharene Skeans this morning and that he will respond at his earliest convenience.

## 2017-12-02 NOTE — Telephone Encounter (Signed)
10-minute discussion with parents concerning my misgivings about this treatment.  As long as he only receives it every other day, he probably will not develop signs of adrenal suppression.  I doubt that this is only going to last for 2 weeks.  I think that is been longer in the past but I would like to see him get through the term, and stay out of the emergency department.

## 2017-12-02 NOTE — Telephone Encounter (Signed)
I called.

## 2017-12-21 ENCOUNTER — Encounter (INDEPENDENT_AMBULATORY_CARE_PROVIDER_SITE_OTHER): Payer: Self-pay

## 2017-12-21 DIAGNOSIS — G514 Facial myokymia: Secondary | ICD-10-CM

## 2017-12-21 DIAGNOSIS — G43009 Migraine without aura, not intractable, without status migrainosus: Secondary | ICD-10-CM

## 2017-12-23 MED ORDER — GABAPENTIN 300 MG PO CAPS: ORAL_CAPSULE | ORAL | 3 refills | Status: DC

## 2018-01-20 ENCOUNTER — Ambulatory Visit (INDEPENDENT_AMBULATORY_CARE_PROVIDER_SITE_OTHER): Payer: 59 | Admitting: Pediatrics

## 2018-01-20 ENCOUNTER — Encounter (INDEPENDENT_AMBULATORY_CARE_PROVIDER_SITE_OTHER): Payer: Self-pay | Admitting: Pediatrics

## 2018-01-20 VITALS — BP 108/72 | HR 64 | Ht 74.25 in | Wt 178.8 lb

## 2018-01-20 DIAGNOSIS — G43009 Migraine without aura, not intractable, without status migrainosus: Secondary | ICD-10-CM | POA: Diagnosis not present

## 2018-01-20 NOTE — Progress Notes (Signed)
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 male who returns on January 20, 2018 for the first time since September 17, 2017.  The patient has history of superior oblique myokymia and migraines.  He had migraines before the head injury and then had a second head injury which caused an involuntary movement of his right eye associated with diplopia and oscillating movements of the eye described as superior oblique myokymia.    This is recorded in his past medical history in detail.  We attempted to treat him with carbamazepine and gabapentin with some success.  He has had exacerbations however that have caused him to miss parts of 4 semesters, which has prolonged his college career.    He was seen by Dr. Acey Bailey at [facility] and had what I think was a recession resection of the right eye in the superior oblique muscle.  Myokymia stopped.  He had some mild diplopia that was most evident when he moved his eye away from midline.  This has gradually subsided to the point where he only experiences this when he first gets up.  His headaches have stopped altogether and they were severe.  He has tapered off the gabapentin and is tapering off carbamazepine.  The only medication that he has is sumatriptan and he has not had to use that.  He plan is to restart school in January.  He thinks that he will graduate in December 2021 or May 2022.  He is feeling well.  I am very pleased for him and hope that this is the end of his myokymia and the end of his migraines.  His mother reminded me that he had migraines before.  I told her that should he  have migraines again, that we would be happy to see him.  Review of Systems: A complete review of systems was assessed and was negative.  Past Medical History Diagnosis Date  . Headache   . Superior oblique myokymia of right eye    Hospitalizations: No., Head Injury: No., Nervous System Infections: No., Immunizations up to date: Yes.    I initially evaluated him in February 2013 when he presented for evaluation of headaches and vomiting.  He had a viral syndrome with fever, chills, myalgias, nausea, and vomiting in December 2012 and when he returned to play basketball in early January, he was easily fatigued, unsteady on his feet, had an early morning frontal headache.  MRI scan of the brain on February 18, 2011 showed subcentimeter T2 hyperintensities in the superficial subcortical region of the frontal and occipital centrum semiovale, left greater than right, which were nonspecific findings sometimes seen with migraineurs.  There was a strong family history of migraine.    I saw him again on November 30, 2013 when he was referred by Dr. Rodman Bailey, pediatric ophthalmologist to evaluate headaches associated with involuntary movement of his right eye associated with diplopia.  In the presence of oscillating movements of the right eye, which she described as superior oblique myokymia.  This was evaluated with Dr. Clydie Bailey, a neuroophthalmologist at [facility].  MRI scan of the brain was performed and unchanged.  Plans were made to  treat him with carbamazepine and timolol and his symptoms subsided.  He has had several exacerbations.   MRI of the brain and orbits without and with contrast was performed July 12, 2017 and was normal.  His most recent exacerbation occurred this fall and caused him to drop out of school yet again.  Concussions in May 2012, when he was knocked to the floor while playing basketball, striking his head. He had a second injury in August 2012, when he went over a jump on jetskis,  and lost his balance and struck his head on the water. He had a third concussion in October 2014, when he developed a severe headache and had vomiting after a football game.  Migraines about twice the year, but these were associated with pounding pain, nausea, vomiting, sensitivity to light, sound, and movement. Headaches that he had associated with his myokymia were moderate frontal headaches with a mild amount of nausea associated with the motion that he perceived because of the eye movements, but did not have vomiting, sensitivity to light, sound, or movement.  MRI scan of the brain on February 17, 2014 showed sub-centimeter T2 hyperintensities in the superficial subcortical region of the frontal and occipital centrum semiovale left greater than right, which were nonspecific findings. Base of the brain failed to show any evidence of entrapment of cranial nerves but the 4th cranial nerve was not specifically seen. Unchanged MRI brain and orbits, November 20, 2013.  The family has been very diligent in investigating this and found an excellent review article on this condition offered by Dr. Acey Lavavid Bailey of [facility].Family again brings up the potential of gabapentin as well as botox in the treatment of his symptoms. They are interested in trying a triptan to help with the headache pain.  MRI brain and orbits July 12, 2017 was normal other than the white matter changes noted previously there was no abnormality otherwise including impingement of the trochlear nerve.  Birth History 8 lbs. 11 oz. infant born at 8039 weeks gestational age to a 21 year old g 1 p 0 male. Gestation was uncomplicated Mother received Epidural anesthesia  Primary cesarean section for fetal distress Nursery Course was uncomplicated Growth and Development was recalled asnormal  Behavior History none  Surgical History Procedure Laterality Date  . CIRCUMCISION  1998  . OTHER SURGICAL HISTORY     Ankle  Surgery x2   Family History family history includes COPD in his maternal grandmother; Cancer in his father; Diabetes in his paternal grandfather. Family history is negative for migraines, seizures, intellectual disabilities, blindness, deafness, birth defects, chromosomal disorder, or autism.  Social History Socioeconomic History  . Marital status: Single  . Years of education: 6915  . Highest education level:  Sophomore in college  Occupational History  . Not currently working  Social Needs  . Financial resource strain: Not on file  . Food insecurity:    Worry: Not on file    Inability: Not on file  . Transportation needs:    Medical: Not on file    Non-medical: Not on file  Tobacco Use  . Smoking status: Never Smoker  . Smokeless tobacco: Never Used  Substance and Sexual Activity  . Alcohol use: No    Alcohol/week: 0.0 standard drinks  . Drug use: No  . 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    He lives with his parents and siblings.     He enjoys  golf and snowboarding.     He is currently a Consulting civil engineer at [employer].   No Known Allergies  Physical Exam BP 108/72   Pulse 64   Ht 6' 2.25" (1.886 m)   Wt 178 lb 12.8 oz (81.1 kg)   BMI 22.80 kg/m   General: alert, well developed, well nourished, in no acute distress, blond hair, blue eyes, right handed Head: normocephalic, no dysmorphic features Ears, Nose and Throat: Otoscopic: tympanic membranes normal; pharynx: oropharynx is pink without exudates or tonsillar hypertrophy Neck: supple, full range of motion, no cranial or cervical bruits Respiratory: auscultation clear Cardiovascular: no murmurs, pulses are normal Musculoskeletal: no skeletal deformities or apparent scoliosis Skin: no rashes or neurocutaneous lesions  Neurologic Exam  Mental Status: alert; oriented to person, place and year; knowledge is normal for age; language is normal Cranial  Nerves: visual fields are full to double simultaneous stimuli; extraocular movements are full and conjugate; pupils are round reactive to light; funduscopic examination shows sharp disc margins with normal vessels; symmetric facial strength; midline tongue and uvula; air conduction is greater than bone conduction bilaterally Motor: Normal strength, tone and mass; good fine motor movements; no pronator drift Sensory: intact responses to cold, vibration, proprioception and stereognosis Coordination: good finger-to-nose, rapid repetitive alternating movements and finger apposition Gait and Station: normal gait and station: patient is able to walk on heels, toes and tandem without difficulty; balance is adequate; Romberg exam is negative; Gower response is negative Reflexes: symmetric and diminished bilaterally; no clonus; bilateral flexor plantar responses  Assessment 1.  Migraine without aura and without status migrainosus, not intractable, G43.009.  Discussion I am pleased that the patient is doing well following his operation and expect that the myokymia has been treated.  Whether or not the migraines will remain quiescent is unclear.  Plan He will return to see me as needed based on his clinical course.  If he requires the use of sumatriptan, I will need to see him at least once or twice a year.  Greater than 50% of a 15 minute visit was spent in counseling and coordination of care concerning his migraines and myokymia.   Medication List   Accurate as of January 20, 2018 11:59 PM.    SUMAtriptan 50 MG tablet Commonly known as:  IMITREX Take 1 at the onset of migraine with 400 mg of ibuprofen do not exceed 1/day    The medication list was reviewed and reconciled. All changes or newly prescribed medications were explained.  A complete medication list was provided to the patient/caregiver.  Deetta Perla MD

## 2018-01-20 NOTE — Patient Instructions (Signed)
I am pleased that your eye movements are returning to normal and that you are not having significant double vision.  Hopefully your migraines will also remain quiescent.  I will see you in follow-up if you continue to have migraines requiring sumatriptan.

## 2019-04-06 ENCOUNTER — Telehealth: Payer: Self-pay | Admitting: Family Medicine

## 2019-04-06 NOTE — Telephone Encounter (Signed)
Pts mother called wanting to establish pcp with Dr. Prince Rome; she stated even though the pt has a pcp listed the pt has never actually seen the pcp.   [phone]

## 2019-04-06 NOTE — Telephone Encounter (Signed)
Ok for this? 

## 2019-04-06 NOTE — Telephone Encounter (Signed)
Yes, certainly

## 2019-04-07 ENCOUNTER — Ambulatory Visit: Payer: 59 | Admitting: Family Medicine

## 2019-04-07 ENCOUNTER — Encounter: Payer: Self-pay | Admitting: Family Medicine

## 2019-04-07 ENCOUNTER — Other Ambulatory Visit: Payer: Self-pay

## 2019-04-07 VITALS — BP 133/84 | HR 76

## 2019-04-07 DIAGNOSIS — S060X0A Concussion without loss of consciousness, initial encounter: Secondary | ICD-10-CM | POA: Diagnosis not present

## 2019-04-07 NOTE — Telephone Encounter (Signed)
Tried calling to get appt scheduled. No answer. LMVM to call back to get scheduled. Will need to be scheduled as NP for 40 minutes for establishment of PCP

## 2019-04-07 NOTE — Progress Notes (Signed)
Spencer BELTRE - 24 y.o. male MRN [id number]  Date of birth: [birth year]  Office Visit Note: Visit Date: 04/07/2019 PCP: Chesley Noon, MD Referred by: Chesley Noon, MD  Subjective: Chief Complaint  Patient presents with  . Dizziness  . Headache  . Nausea   HPI: Spencer Bailey is a 24 y.o. male who comes in today with headache, dizziness, nausea 1 week after a fall.  He is unsure how he fell but woke up on a bathroom floor after his shower with his towel on, bath mat folded up as his he had tripped. Does not recall falling. The morning of the fall, he had a headache. Since fall, he has had persistent headache, nausea, dizziness.   H/o multiple concussions (5-from high school football; last in high school, 2016, 2.5 weeks to recover H/o migraines- found to be caused by superior oblique myokemia, has mostly resolved since surgery in [place] in 2019.  SCAT 5: Date of concussion: 03/31/2019 LOC at time of injury? yes Neck exam : normal ROM, no TTP  Symptom score:  16/22 Severity score: 51/132      ROS Otherwise per HPI.  Assessment & Plan: Visit Diagnoses:  1. Concussion without loss of consciousness, initial encounter     Plan: Discussed vestibular type concussion with patient. Suspect that he will gradually improve; provided vestibular home rehab exercises. Trial melatonin to help with sleep regulation, fish oil and magnesium to help with headache/ concussion. Given history of superior oblique myokymia and migraines, lower threshold to image if symptoms persist.   Meds & Orders: No orders of the defined types were placed in this encounter.  No orders of the defined types were placed in this encounter.   Follow-up: No follow-ups on file.   Procedures: No procedures performed  No notes on file   Clinical History: No specialty comments available.   He reports that he has never smoked. He has never used smokeless tobacco. No results for input(s): HGBA1C, LABURIC  in the last 8760 hours.  Objective:  VS:  HT:    WT:   BMI:     BP:133/84  HR:76bpm  TEMP: ( )  RESP:   HEENT: clear conjunctiva,  CV:  no edema, capillary refill brisk, normal rate Resp: non-labored Skin: no rashes, normal turgor  Psych:  alert and oriented  Neuro: CN II-XII grossly intact. Finger to nose testing normal with no dysmetria. PERRL Vestibular Ocular-Motor Screening (VOMS: Smooth pursuit horizontal normal Smooth pursuit vertical normal Saccades horizontal - normal, vertical- caused dizziness, downbeating nystagmus Convergence: ~6 cm Vestibular motion sensitivity: some dizziness with vertical testing  Balance (maximum is 10 errors per stance) Non -Dominant foot is         left  (Test with non dominant foot, for 20 sec each, record # errors and add)  # of errors in Double leg stance   0 # of errors in Single leg stance     0 # of errors in Tandem stance        0 TOTAL # errors for balance testing (30) 0  With eyes closed: 1 in single leg stance, 1 in tandem stsance    Imaging: No results found.  Past Medical/Family/Surgical/Social History: Medications & Allergies reviewed per EMR, new medications updated. Patient Active Problem List   Diagnosis Date Noted  . Complicated migraine 64/33/2951  . Concussion without loss of consciousness 07/07/2014  . Episodic tension type headache 12/16/2013  . Superior oblique myokymia of right  eye 11/30/2013  . Migraine without aura and without status migrainosus, not intractable 11/30/2013  . History of head injury 11/30/2013   Past Medical History:  Diagnosis Date  . Headache   . Superior oblique myokymia of right eye    Family History  Problem Relation Age of Onset  . Cancer Father   . Diabetes Paternal Grandfather   . COPD Maternal Grandmother    Past Surgical History:  Procedure Laterality Date  . CIRCUMCISION  1998  . OTHER SURGICAL HISTORY     Ankle Surgery x2   Social History   Occupational  History  . Not on file  Tobacco Use  . Smoking status: Never Smoker  . Smokeless tobacco: Never Used  Substance and Sexual Activity  . Alcohol use: No    Alcohol/week: 0.0 standard drinks  . Drug use: No  . Sexual activity: Never

## 2019-04-07 NOTE — Progress Notes (Signed)
I saw and examined the patient with Dr. Robby Sermon and agree with assessment and plan as outlined.    Possible concussion last week.  Not sure how he fell in bathroom, but was wearing towel around waist.  Had migraine all day leading up to the event.  Still having some dizziness.  Will try magnesium, melatonin, fish oil.  Home vestibular rehab exercises.  Consider brain MRI if symptoms persist.

## 2019-04-13 ENCOUNTER — Encounter: Payer: Self-pay | Admitting: Family Medicine

## 2019-04-13 DIAGNOSIS — R200 Anesthesia of skin: Secondary | ICD-10-CM

## 2019-04-13 DIAGNOSIS — S060X0A Concussion without loss of consciousness, initial encounter: Secondary | ICD-10-CM

## 2019-05-02 ENCOUNTER — Ambulatory Visit
Admission: RE | Admit: 2019-05-02 | Discharge: 2019-05-02 | Disposition: A | Discharge status: Home / Self Care | Payer: 59 | Source: Ambulatory Visit | Attending: Family Medicine | Admitting: Family Medicine

## 2019-05-02 DIAGNOSIS — S060X0A Concussion without loss of consciousness, initial encounter: Secondary | ICD-10-CM

## 2019-05-02 DIAGNOSIS — R200 Anesthesia of skin: Secondary | ICD-10-CM

## 2019-05-04 ENCOUNTER — Telehealth: Payer: Self-pay | Admitting: Family Medicine

## 2019-05-04 NOTE — Telephone Encounter (Signed)
Brain MRI looks good overall.  Some non-specific white matter changes in the left cerebral hemisphere are unchanged from 2013.  Nothing that would explain the recent arm/face numbness symptoms.  Most likely that was related to migraine.

## 2019-06-22 ENCOUNTER — Encounter: Payer: Self-pay | Admitting: Family Medicine

## 2019-06-22 ENCOUNTER — Other Ambulatory Visit: Payer: Self-pay

## 2019-06-22 ENCOUNTER — Ambulatory Visit: Payer: 59 | Admitting: Family Medicine

## 2019-06-22 DIAGNOSIS — Q742 Other congenital malformations of lower limb(s), including pelvic girdle: Secondary | ICD-10-CM

## 2019-06-22 NOTE — Progress Notes (Signed)
   Office Visit Note   Patient: Spencer Bailey           Date of Birth: 1995/09/01           MRN: [id number] Visit Date: 06/22/2019 Requested by: Eartha Inch, MD [address] [place],  [place] [address] PCP: Eartha Inch, MD  Subjective: Chief Complaint  Patient presents with  . Left Ankle - Pain  . Right Ankle - Pain    HPI: He is here for clearance for his feet.  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  Since he had the surgery he has been completely asymptomatic.  He has not required any medications for pain.  He has remained very active playing sports and never has any troubles with his feet.              ROS:   All other systems were reviewed and are negative.  Objective: Vital Signs: There were no vitals taken for this visit.  Physical Exam:  General:  Alert and oriented, in no acute distress. Pulm:  Breathing unlabored. Psy:  Normal mood, congruent affect. Skin: Well-healed surgical scar on the medial side of both feet at the navicular. Feet: Full ankle range of motion bilaterally, good range of motion of the midfoot.  No tenderness to palpation of the navicular bones.  No pain with supination of the feet against resistance or with flexion/extension of the toes.  He is able to walk on his tiptoes and on his heels without any pain.  Arches in his feet are well-preserved.  Imaging: No results found.  Assessment & Plan: 1.  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 -He is cleared for full activities without restriction.  I do not anticipate any future problems with his feet as a result of his surgeries.     Procedures: No procedures performed  No notes on file     PMFS History: Patient Active Problem List   Diagnosis Date Noted  . Complicated migraine 01/24/2017  . Concussion  without loss of consciousness 07/07/2014  . Episodic tension type headache 12/16/2013  . Superior oblique myokymia of right eye 11/30/2013  . Migraine without aura and without status migrainosus, not intractable 11/30/2013  . History of head injury 11/30/2013   Past Medical History:  Diagnosis Date  . Headache   . Superior oblique myokymia of right eye     Family History  Problem Relation Age of Onset  . Cancer Father   . Diabetes Paternal Grandfather   . COPD Maternal Grandmother     Past Surgical History:  Procedure Laterality Date  . CIRCUMCISION  1998  . OTHER SURGICAL HISTORY     Ankle Surgery x2   Social History   Occupational History  . Not on file  Tobacco Use  . Smoking status: Never Smoker  . Smokeless tobacco: Never Used  Substance and Sexual Activity  . Alcohol use: No    Alcohol/week: 0.0 standard drinks  . Drug use: No  . Sexual activity: Never

## 2020-01-11 ENCOUNTER — Ambulatory Visit: Payer: Self-pay | Admitting: Family Medicine

## 2020-01-12 ENCOUNTER — Ambulatory Visit (INDEPENDENT_AMBULATORY_CARE_PROVIDER_SITE_OTHER): Payer: BC Managed Care – PPO | Admitting: Family Medicine

## 2020-01-12 ENCOUNTER — Other Ambulatory Visit: Payer: Self-pay

## 2020-01-12 ENCOUNTER — Ambulatory Visit: Payer: Self-pay

## 2020-01-12 ENCOUNTER — Encounter: Payer: Self-pay | Admitting: Family Medicine

## 2020-01-12 DIAGNOSIS — M545 Low back pain, unspecified: Secondary | ICD-10-CM

## 2020-01-12 NOTE — Progress Notes (Signed)
Office Visit Note   Patient: Spencer Bailey           Date of Birth: [birth year]           MRN: [id number] Visit Date: 01/12/2020 Requested by: Eartha Inch, MD [address] [place],  [place] [address] PCP: Eartha Inch, MD  Subjective: Chief Complaint  Patient presents with  . Lower Back - Pain    Pain in the left lower back and into the posterior hip. Numbness in the left leg down to the foot. Keeps him from sleeping well. Started in July, after throwing Mom in the pool.    HPI: He is here with low back and left leg pain. Symptoms started in July, he was attempting to throw his mother into the swimming pool and felt immediate pain. He started seeing Dr. Stephannie Peters for chiropractic treatments and was getting temporary relief, but the pain keeps coming back. His leg goes numb intermittently and the pain shoots down the back of his hip into the leg and sometimes down to the foot. It usually feels better when he gets up and moves around. Denies any bowel or bladder dysfunction. He is not taking medication for the pain.  Unfortunately his father died the other day at age 66.               ROS:   All other systems were reviewed and are negative.  Objective: Vital Signs: There were no vitals taken for this visit.  Physical Exam:  General:  Alert and oriented, in no acute distress. Pulm:  Breathing unlabored. Psy:  Normal mood, congruent affect.  Low back: He has tenderness to the left of the left SI joint. No midline tenderness, negative stork test, straight leg raise is positive on the left, negative on the right. Lower extremity strength and reflexes remain normal.  Imaging: X-rays of lumbar spine reveal anatomic alignment, no congenital deformities, no sign of compression fracture or pars defect. Hip joints look normal.    Assessment & Plan: 1. Low back pain with left-sided sciatica, concerning for disc protrusion. -Discussed options with him and elected to try physical  therapy at [facility] in [place]. If he fails to improve, then MRI scan. He will call for medications if needed.     Procedures: No procedures performed  No notes on file     PMFS History: Patient Active Problem List   Diagnosis Date Noted  . Complicated migraine 01/24/2017  . Concussion without loss of consciousness 07/07/2014  . Episodic tension type headache 12/16/2013  . Superior oblique myokymia of right eye 11/30/2013  . Migraine without aura and without status migrainosus, not intractable 11/30/2013  . History of head injury 11/30/2013   Past Medical History:  Diagnosis Date  . Headache   . Superior oblique myokymia of right eye     Family History  Problem Relation Age of Onset  . Cancer Father   . Diabetes Paternal Grandfather   . COPD Maternal Grandmother     Past Surgical History:  Procedure Laterality Date  . CIRCUMCISION  1998  . OTHER SURGICAL HISTORY     Ankle Surgery x2   Social History   Occupational History  . Not on file  Tobacco Use  . Smoking status: Never Smoker  . Smokeless tobacco: Never Used  Substance and Sexual Activity  . Alcohol use: No    Alcohol/week: 0.0 standard drinks  . Drug use: No  . Sexual activity: Never

## 2020-06-09 ENCOUNTER — Encounter (INDEPENDENT_AMBULATORY_CARE_PROVIDER_SITE_OTHER): Payer: Self-pay

## 2020-10-21 ENCOUNTER — Other Ambulatory Visit: Payer: Self-pay

## 2020-10-21 ENCOUNTER — Ambulatory Visit (HOSPITAL_BASED_OUTPATIENT_CLINIC_OR_DEPARTMENT_OTHER): Payer: BC Managed Care – PPO | Admitting: Orthopaedic Surgery

## 2020-10-21 ENCOUNTER — Encounter (HOSPITAL_BASED_OUTPATIENT_CLINIC_OR_DEPARTMENT_OTHER): Payer: Self-pay | Admitting: Orthopaedic Surgery

## 2020-10-21 ENCOUNTER — Other Ambulatory Visit (HOSPITAL_BASED_OUTPATIENT_CLINIC_OR_DEPARTMENT_OTHER): Payer: Self-pay

## 2020-10-21 VITALS — BP 134/93 | Ht 74.0 in | Wt 170.0 lb

## 2020-10-21 DIAGNOSIS — G8929 Other chronic pain: Secondary | ICD-10-CM

## 2020-10-21 DIAGNOSIS — S29012A Strain of muscle and tendon of back wall of thorax, initial encounter: Secondary | ICD-10-CM

## 2020-10-21 DIAGNOSIS — M545 Low back pain, unspecified: Secondary | ICD-10-CM

## 2020-10-21 MED ORDER — METHOCARBAMOL 500 MG PO TABS
500.0000 mg | ORAL_TABLET | Freq: Four times a day (QID) | ORAL | 2 refills | Status: AC
  Filled 2020-10-21: qty 30, 8d supply, fill #0

## 2020-10-21 MED ORDER — MELOXICAM 15 MG PO TABS
15.0000 mg | ORAL_TABLET | Freq: Every day | ORAL | 3 refills | Status: AC
  Filled 2020-10-21: qty 30, 30d supply, fill #0

## 2020-10-21 NOTE — Progress Notes (Signed)
Chief Complaint: left lower back pain     History of Present Illness:   Pain Score: 6/10 SANE: 85/100  Spencer Bailey is a 25 y.o. male with left lower back pain has been going on since July 2021.  He states that the pain initially started when he was going to throw his mother in a pool and twisted the back.  Since that time he has had significant intermittent pain about the left lower back area near the latissimus and posterior obliques.  He currently works as a Occupational hygienist and does not necessarily notice the pain while flying but does notice it while you are sitting activities like driving.  He states that sometimes the pain occasionally radiates to the left buttocks.  He has trialed physical therapy for 4 months which he does say it helps although this is only transient relief for the week.  He has not taken any medications for it.  He is here today asking about additional treatments.  He enjoys golfing although he has not been able to do this recently due to being busy with work    Surgical History:   None  PMH/PSH/Family History/Social History/Meds/Allergies:    Past Medical History:  Diagnosis Date   Headache    Superior oblique myokymia of right eye    Past Surgical History:  Procedure Laterality Date   CIRCUMCISION  1998   OTHER SURGICAL HISTORY     Ankle Surgery x2   Social History   Socioeconomic History   Marital status: Single    Spouse name: Not on file   Number of children: Not on file   Years of education: Not on file   Highest education level: Not on file  Occupational History   Not on file  Tobacco Use   Smoking status: Never   Smokeless tobacco: Never  Substance and Sexual Activity   Alcohol use: No    Alcohol/week: 0.0 standard drinks   Drug use: No   Sexual activity: Never  Other Topics Concern   Not on file  Social History Narrative   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   He lives with his parents and  siblings.    He enjoys golf and snowboarding.    He is currently a Consulting civil engineer at [employer].   Social Determinants of Health   Financial Resource Strain: Not on file  Food Insecurity: Not on file  Transportation Needs: Not on file  Physical Activity: Not on file  Stress: Not on file  Social Connections: Not on file   Family History  Problem Relation Age of Onset   Cancer Father    Diabetes Paternal Grandfather    COPD Maternal Grandmother    No Known Allergies No current outpatient medications on file.   No current facility-administered medications for this visit.   No results found.  Review of Systems:   A ROS was performed including pertinent positives and negatives as documented in the HPI.  Physical Exam :   Constitutional: NAD and appears stated age Neurological: Alert and oriented Psych: Appropriate affect and cooperative Blood pressure (!) 134/93, height 6\' 2"  (1.88 m), weight 170 lb (77.1 kg).   Comprehensive Musculoskeletal Exam:   There is tenderness to palpation directly over the left inferior portion of the latissimus and more lateral into  the posterior obliques as well.  There is aggravated pain with twisting type activities.  He has full strength and sensation in bilateral lower extremities.  Imaging:   Xray (lumbar spine 2 views): Normal I personally reviewed and interpreted the radiographs.   Assessment:   25 year old male with strain of his left inferior posterior obliques as well as latissimus.  I advised that he is likely dealing with a chronic strain at this point.  At this time I believe the best treatment would be to perform aggressive stretching to this effect we will send a physical therapy prescription to his therapist who was previously working with him.  I have asked that he get a home regiment that he can work on at home due to the constraints of his job.  I have also prescribed Mobic as well as Robaxin to help give him the best chance of  relieving this chronic strain permanently.  Plan :    -Robaxin as well as meloxicam prescribed today -External physical therapy referral so that he can get a home stretching regiment    I personally saw and evaluated the patient, and participated in the management and treatment plan.  Huel Cote, MD Attending Physician, Orthopedic Surgery  This document was dictated using Dragon voice recognition software. A reasonable attempt at proof reading has been made to minimize errors.

## 2021-02-01 ENCOUNTER — Other Ambulatory Visit (HOSPITAL_COMMUNITY): Payer: Self-pay

## 2021-12-15 IMAGING — MR MR HEAD W/O CM
10 series · 48 of 48 positions shown · non-contrast
Comparison: 07/12/2017

CLINICAL DATA: Concussion without loss of consciousness on
03/31/2019. Slipped in shower and hit head. Left hand numbness.
Facial numbness. Intermittent migraines.

EXAM:
MRI HEAD WITHOUT CONTRAST
TECHNIQUE: Multiplanar, multiecho pulse sequences of the brain and surrounding
structures were obtained without intravenous contrast.

[Series 2: T1 · sagittal · 5.0mm · 0.47mm/px · 3 of 23 slices shown]
[im 1/23]
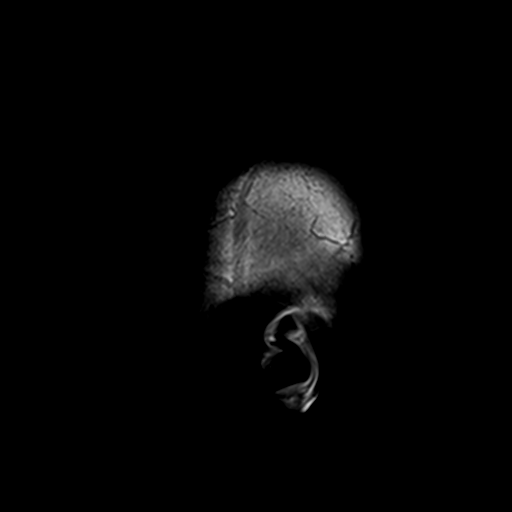
[im 12/23]
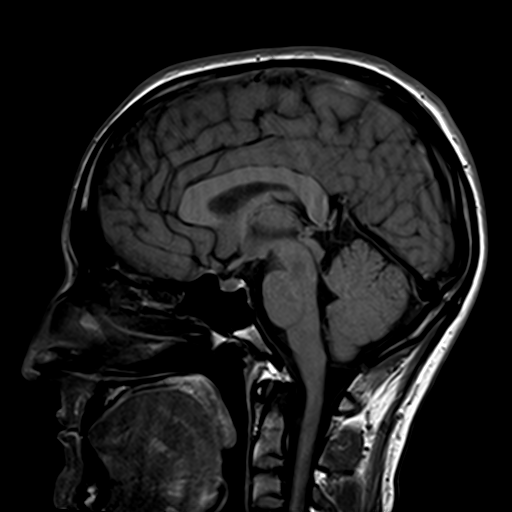
[im 23/23]
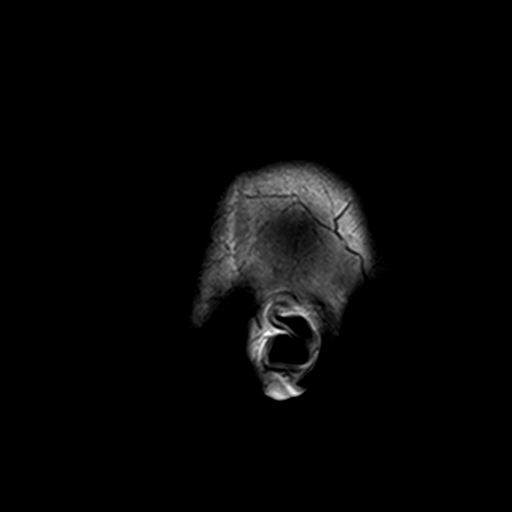

[Series 3: DWI · axial · 3.0mm · 1.88mm/px · z∈[-99,+49]mm · 10 of 104 slices shown (1 of 4)]
[im 1/104]
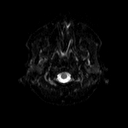
[im 12/104]
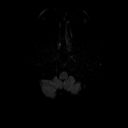
[im 23/104]
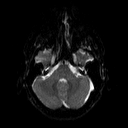
[im 35/104]
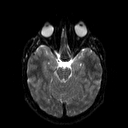
[im 46/104]
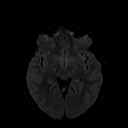
[im 58/104]
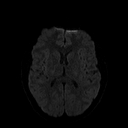
[im 69/104]
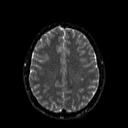
[im 81/104]
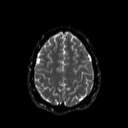
[im 92/104]
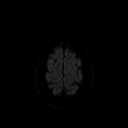
[im 104/104]
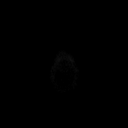

[Series 4: DWI · axial · 3.0mm · 1.88mm/px · z∈[-99,+49]mm · 4 of 52 slices shown (2 of 4)]
[im 1/52]
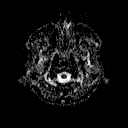
[im 18/52]
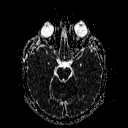
[im 35/52]
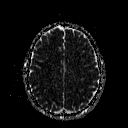
[im 52/52]
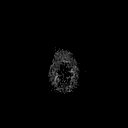

[Series 5: DWI · coronal · 5.0mm · 1.80mm/px · 6 of 71 slices shown (3 of 4)]
[im 1/71]
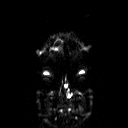
[im 15/71]
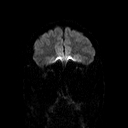
[im 29/71]
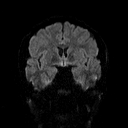
[im 43/71]
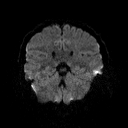
[im 57/71]
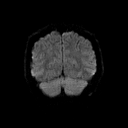
[im 71/71]
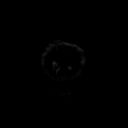

[Series 6: DWI · coronal · 5.0mm · 1.80mm/px · 3 of 36 slices shown (4 of 4)]
[im 1/36]
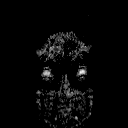
[im 18/36]
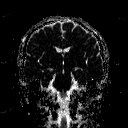
[im 36/36]
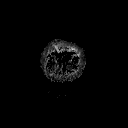

[Series 7: T2 · axial · 5.0mm · 0.54mm/px · z∈[-110,+44]mm · 2 of 24 slices shown (1 of 2)]
[im 1/24]
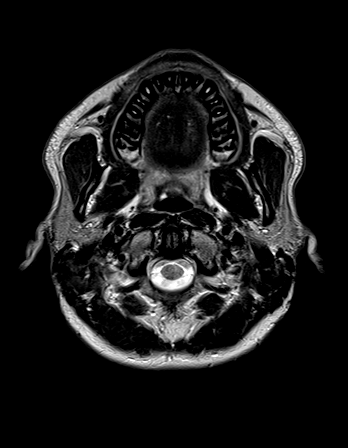
[im 24/24]
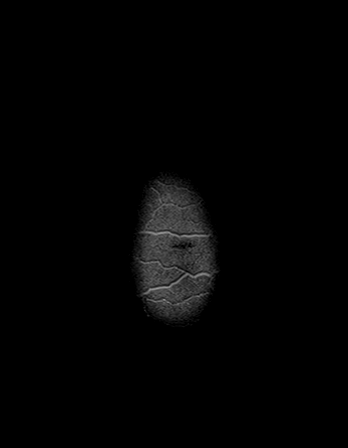

[Series 8: FLAIR · axial · 3.0mm · 0.47mm/px · z∈[-112,+43]mm · 3 of 36 slices shown]
[im 1/36]
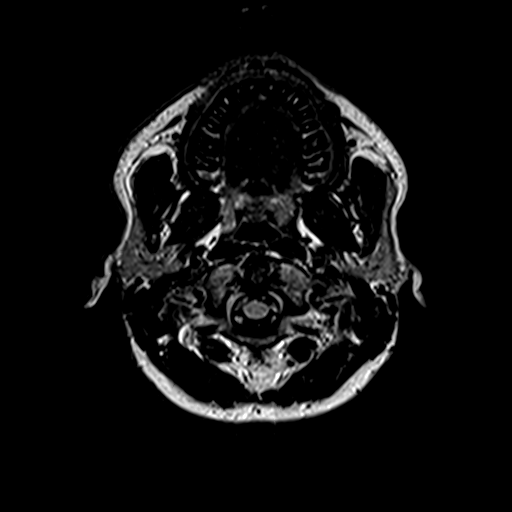
[im 18/36]
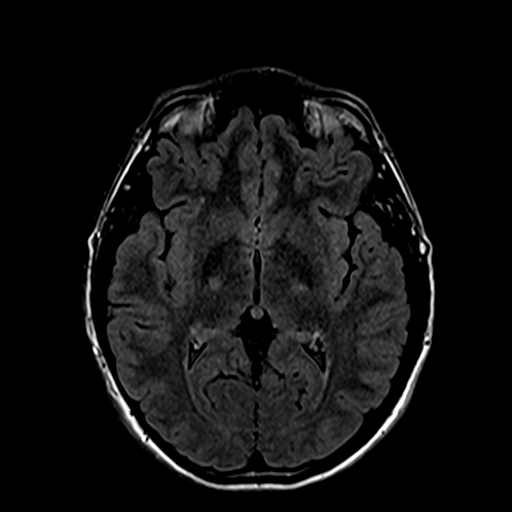
[im 36/36]
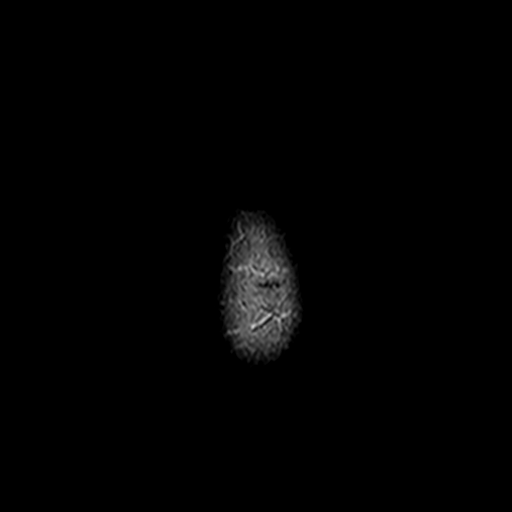

[Series 10: swi_images · axial · 4.0mm · 0.94mm/px · z∈[-108,+42]mm · 3 of 40 slices shown]
[im 1/40]
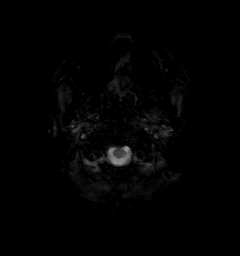
[im 20/40]
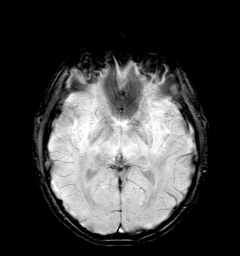
[im 40/40]
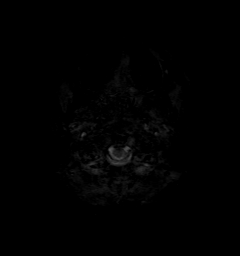

[Series 11: t1_mpr_tra · axial · 1.1mm · 0.75mm/px · z∈[-111,+46]mm · 12 of 144 slices shown]
[im 1/144]
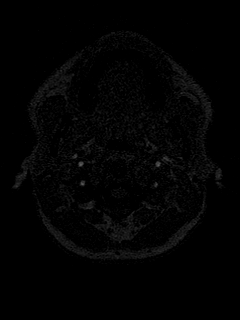
[im 14/144]
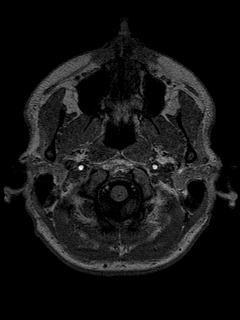
[im 27/144]
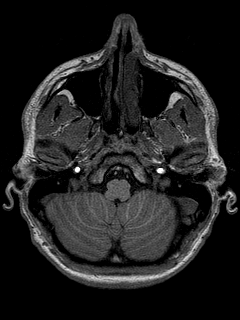
[im 40/144]
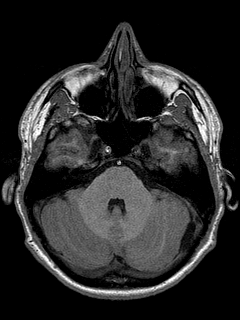
[im 53/144]
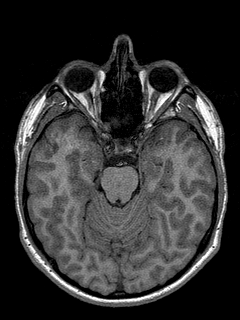
[im 66/144]
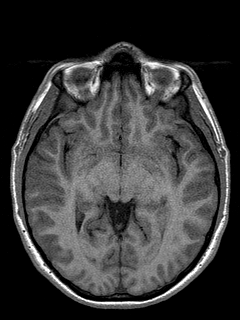
[im 79/144]
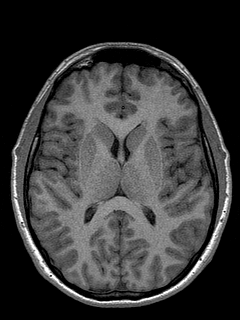
[im 92/144]
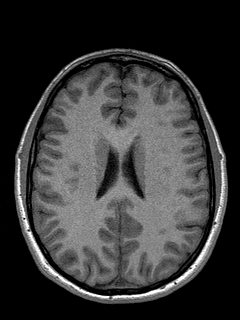
[im 105/144]
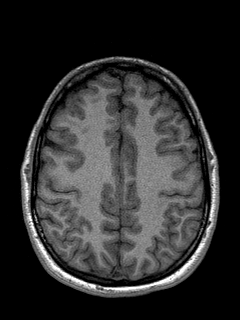
[im 118/144]
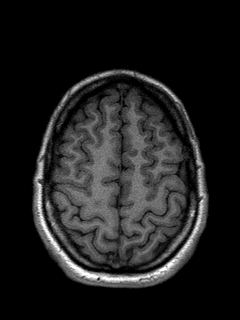
[im 131/144]
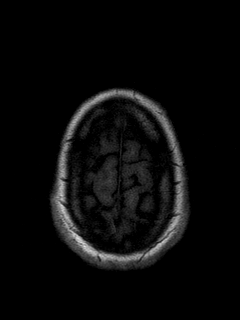
[im 144/144]
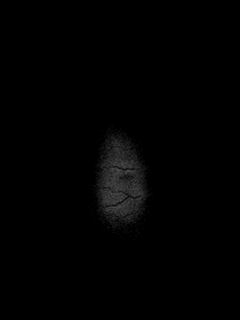

[Series 12: T2 · coronal · 5.0mm · 0.45mm/px · 2 of 29 slices shown (2 of 2)]
[im 1/29]
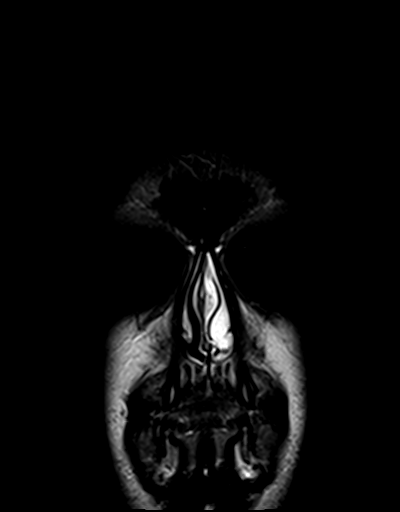
[im 29/29]
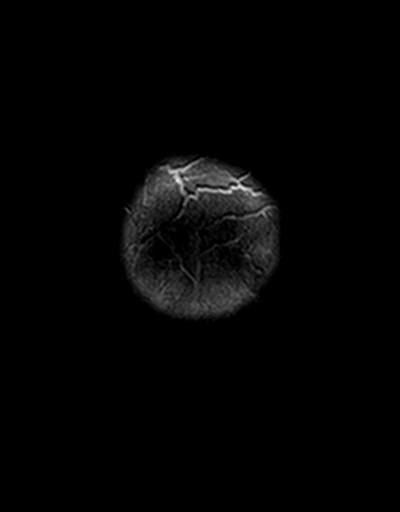

[48 of 48 positions shown; findings below may reference images not displayed]

FINDINGS: Brain: There is no evidence of acute infarct, intracranial
hemorrhage, mass, midline shift, or extra-axial fluid collection.
The ventricles and sulci are normal. A few small foci of T2 FLAIR
hyperintensity in the left cerebral hemispheric white matter are
unchanged. Minimal asymmetric extra-axial CSF lateral to the left
cerebellum is unchanged from 0359.

Vascular: Major intracranial vascular flow voids are preserved.

Skull and upper cervical spine: Unremarkable bone marrow signal.

Sinuses/Orbits: Unremarkable orbits. Minimal right sphenoid sinus
mucosal thickening, greatly improved from the prior study. Clear
mastoid air cells.

Other: None.
IMPRESSION: 1. No acute intracranial abnormality.
2. Unchanged minimal T2 hyperintensities in the left cerebral
hemispheric white matter, nonspecific.

## 2021-12-19 ENCOUNTER — Other Ambulatory Visit: Payer: Self-pay
